# Patient Record
Sex: Male | Born: 1995 | Race: Black or African American | Hispanic: No | Marital: Single | Smoking: Never smoker
Health system: Southern US, Community
[De-identification: ages and names within clinical notes are randomized; demographics above are authoritative.]

## PROBLEM LIST (undated history)

## (undated) DIAGNOSIS — Z993 Dependence on wheelchair: Secondary | ICD-10-CM

## (undated) DIAGNOSIS — E559 Vitamin D deficiency, unspecified: Secondary | ICD-10-CM

## (undated) DIAGNOSIS — Z978 Presence of other specified devices: Secondary | ICD-10-CM

---

## 2019-09-30 ENCOUNTER — Encounter (HOSPITAL_COMMUNITY): Payer: Self-pay

## 2019-09-30 ENCOUNTER — Inpatient Hospital Stay (HOSPITAL_COMMUNITY): Payer: Worker's Compensation

## 2019-09-30 ENCOUNTER — Encounter (HOSPITAL_COMMUNITY): Admission: EM | Disposition: A | Discharge status: Rehabilitation Facility | Payer: Self-pay | Source: Home / Self Care

## 2019-09-30 ENCOUNTER — Inpatient Hospital Stay (HOSPITAL_COMMUNITY)
Admission: EM | Admit: 2019-09-30 | Discharge: 2019-10-12 | DRG: 958 | Disposition: A | Discharge status: Rehabilitation Facility | Payer: Worker's Compensation | Attending: Surgery | Admitting: Surgery

## 2019-09-30 ENCOUNTER — Emergency Department (HOSPITAL_COMMUNITY): Payer: Worker's Compensation

## 2019-09-30 ENCOUNTER — Emergency Department (HOSPITAL_COMMUNITY): Payer: Worker's Compensation | Admitting: Anesthesiology

## 2019-09-30 DIAGNOSIS — S3210XA Unspecified fracture of sacrum, initial encounter for closed fracture: Secondary | ICD-10-CM | POA: Diagnosis present

## 2019-09-30 DIAGNOSIS — R7881 Bacteremia: Secondary | ICD-10-CM | POA: Diagnosis not present

## 2019-09-30 DIAGNOSIS — S0081XA Abrasion of other part of head, initial encounter: Secondary | ICD-10-CM | POA: Diagnosis present

## 2019-09-30 DIAGNOSIS — G479 Sleep disorder, unspecified: Secondary | ICD-10-CM | POA: Diagnosis not present

## 2019-09-30 DIAGNOSIS — R05 Cough: Secondary | ICD-10-CM | POA: Diagnosis not present

## 2019-09-30 DIAGNOSIS — S32059A Unspecified fracture of fifth lumbar vertebra, initial encounter for closed fracture: Secondary | ICD-10-CM | POA: Diagnosis present

## 2019-09-30 DIAGNOSIS — M7989 Other specified soft tissue disorders: Secondary | ICD-10-CM | POA: Diagnosis not present

## 2019-09-30 DIAGNOSIS — S81011A Laceration without foreign body, right knee, initial encounter: Secondary | ICD-10-CM | POA: Diagnosis present

## 2019-09-30 DIAGNOSIS — S022XXA Fracture of nasal bones, initial encounter for closed fracture: Secondary | ICD-10-CM | POA: Diagnosis present

## 2019-09-30 DIAGNOSIS — H53142 Visual discomfort, left eye: Secondary | ICD-10-CM | POA: Diagnosis present

## 2019-09-30 DIAGNOSIS — I959 Hypotension, unspecified: Secondary | ICD-10-CM | POA: Diagnosis present

## 2019-09-30 DIAGNOSIS — E559 Vitamin D deficiency, unspecified: Secondary | ICD-10-CM | POA: Diagnosis present

## 2019-09-30 DIAGNOSIS — S334XXA Traumatic rupture of symphysis pubis, initial encounter: Secondary | ICD-10-CM | POA: Diagnosis present

## 2019-09-30 DIAGNOSIS — R52 Pain, unspecified: Secondary | ICD-10-CM

## 2019-09-30 DIAGNOSIS — Z09 Encounter for follow-up examination after completed treatment for conditions other than malignant neoplasm: Secondary | ICD-10-CM

## 2019-09-30 DIAGNOSIS — E8889 Other specified metabolic disorders: Secondary | ICD-10-CM | POA: Diagnosis present

## 2019-09-30 DIAGNOSIS — S51012A Laceration without foreign body of left elbow, initial encounter: Secondary | ICD-10-CM | POA: Diagnosis present

## 2019-09-30 DIAGNOSIS — R200 Anesthesia of skin: Secondary | ICD-10-CM | POA: Diagnosis not present

## 2019-09-30 DIAGNOSIS — Z23 Encounter for immunization: Secondary | ICD-10-CM | POA: Diagnosis not present

## 2019-09-30 DIAGNOSIS — Z20822 Contact with and (suspected) exposure to covid-19: Secondary | ICD-10-CM | POA: Diagnosis present

## 2019-09-30 DIAGNOSIS — S32811A Multiple fractures of pelvis with unstable disruption of pelvic ring, initial encounter for closed fracture: Secondary | ICD-10-CM | POA: Diagnosis present

## 2019-09-30 DIAGNOSIS — R310 Gross hematuria: Secondary | ICD-10-CM | POA: Diagnosis not present

## 2019-09-30 DIAGNOSIS — D696 Thrombocytopenia, unspecified: Secondary | ICD-10-CM | POA: Diagnosis not present

## 2019-09-30 DIAGNOSIS — S3730XA Unspecified injury of urethra, initial encounter: Secondary | ICD-10-CM | POA: Diagnosis present

## 2019-09-30 DIAGNOSIS — H532 Diplopia: Secondary | ICD-10-CM | POA: Diagnosis not present

## 2019-09-30 DIAGNOSIS — S0231XA Fracture of orbital floor, right side, initial encounter for closed fracture: Secondary | ICD-10-CM

## 2019-09-30 DIAGNOSIS — R443 Hallucinations, unspecified: Secondary | ICD-10-CM | POA: Diagnosis not present

## 2019-09-30 DIAGNOSIS — T1490XA Injury, unspecified, initial encounter: Secondary | ICD-10-CM | POA: Diagnosis present

## 2019-09-30 DIAGNOSIS — N5089 Other specified disorders of the male genital organs: Secondary | ICD-10-CM | POA: Diagnosis not present

## 2019-09-30 DIAGNOSIS — S32512A Fracture of superior rim of left pubis, initial encounter for closed fracture: Secondary | ICD-10-CM | POA: Diagnosis present

## 2019-09-30 DIAGNOSIS — S7401XA Injury of sciatic nerve at hip and thigh level, right leg, initial encounter: Secondary | ICD-10-CM | POA: Diagnosis present

## 2019-09-30 DIAGNOSIS — S0232XA Fracture of orbital floor, left side, initial encounter for closed fracture: Secondary | ICD-10-CM | POA: Diagnosis present

## 2019-09-30 DIAGNOSIS — R000 Tachycardia, unspecified: Secondary | ICD-10-CM | POA: Diagnosis present

## 2019-09-30 DIAGNOSIS — S329XXA Fracture of unspecified parts of lumbosacral spine and pelvis, initial encounter for closed fracture: Secondary | ICD-10-CM

## 2019-09-30 DIAGNOSIS — B957 Other staphylococcus as the cause of diseases classified elsewhere: Secondary | ICD-10-CM | POA: Diagnosis not present

## 2019-09-30 DIAGNOSIS — Z419 Encounter for procedure for purposes other than remedying health state, unspecified: Secondary | ICD-10-CM

## 2019-09-30 DIAGNOSIS — R609 Edema, unspecified: Secondary | ICD-10-CM | POA: Diagnosis not present

## 2019-09-30 DIAGNOSIS — M21371 Foot drop, right foot: Secondary | ICD-10-CM | POA: Diagnosis not present

## 2019-09-30 DIAGNOSIS — D62 Acute posthemorrhagic anemia: Secondary | ICD-10-CM | POA: Diagnosis present

## 2019-09-30 DIAGNOSIS — Z9289 Personal history of other medical treatment: Secondary | ICD-10-CM

## 2019-09-30 DIAGNOSIS — M532X8 Spinal instabilities, sacral and sacrococcygeal region: Secondary | ICD-10-CM | POA: Diagnosis present

## 2019-09-30 DIAGNOSIS — R509 Fever, unspecified: Secondary | ICD-10-CM

## 2019-09-30 DIAGNOSIS — S32121A Minimally displaced Zone II fracture of sacrum, initial encounter for closed fracture: Secondary | ICD-10-CM | POA: Diagnosis present

## 2019-09-30 HISTORY — DX: Personal history of other medical treatment: Z92.89

## 2019-09-30 HISTORY — PX: EXTERNAL FIXATION PELVIS: SHX1551

## 2019-09-30 HISTORY — PX: INSERTION OF SUPRAPUBIC CATHETER: SHX5870

## 2019-09-30 LAB — COMPREHENSIVE METABOLIC PANEL
ALT: 35 U/L (ref 0–44)
AST: 40 U/L (ref 15–41)
Albumin: 3.8 g/dL (ref 3.5–5.0)
Alkaline Phosphatase: 41 U/L (ref 38–126)
Anion gap: 14 (ref 5–15)
BUN: 14 mg/dL (ref 6–20)
CO2: 21 mmol/L — ABNORMAL LOW (ref 22–32)
Calcium: 9 mg/dL (ref 8.9–10.3)
Chloride: 104 mmol/L (ref 98–111)
Creatinine, Ser: 1.85 mg/dL — ABNORMAL HIGH (ref 0.61–1.24)
GFR calc Af Amer: 58 mL/min — ABNORMAL LOW (ref >60)
GFR calc non Af Amer: 50 mL/min — ABNORMAL LOW (ref >60)
Glucose, Bld: 210 mg/dL — ABNORMAL HIGH (ref 70–99)
Potassium: 3.3 mmol/L — ABNORMAL LOW (ref 3.5–5.1)
Sodium: 139 mmol/L (ref 135–145)
Total Bilirubin: 0.7 mg/dL (ref 0.3–1.2)
Total Protein: 6.1 g/dL — ABNORMAL LOW (ref 6.5–8.1)

## 2019-09-30 LAB — POCT I-STAT 7, (LYTES, BLD GAS, ICA,H+H)
Acid-Base Excess: 0 mmol/L (ref 0.0–2.0)
Bicarbonate: 26.8 mmol/L (ref 20.0–28.0)
Calcium, Ion: 1.29 mmol/L (ref 1.15–1.40)
HCT: 36 % — ABNORMAL LOW (ref 39.0–52.0)
Hemoglobin: 12.2 g/dL — ABNORMAL LOW (ref 13.0–17.0)
O2 Saturation: 100 %
Patient temperature: 36
Potassium: 4.1 mmol/L (ref 3.5–5.1)
Sodium: 142 mmol/L (ref 135–145)
TCO2: 28 mmol/L (ref 22–32)
pCO2 arterial: 49.8 mmHg — ABNORMAL HIGH (ref 32.0–48.0)
pH, Arterial: 7.334 — ABNORMAL LOW (ref 7.350–7.450)
pO2, Arterial: 564 mmHg — ABNORMAL HIGH (ref 83.0–108.0)

## 2019-09-30 LAB — I-STAT CHEM 8, ED
BUN: 14 mg/dL (ref 6–20)
Calcium, Ion: 1.02 mmol/L — ABNORMAL LOW (ref 1.15–1.40)
Chloride: 105 mmol/L (ref 98–111)
Creatinine, Ser: 1.8 mg/dL — ABNORMAL HIGH (ref 0.61–1.24)
Glucose, Bld: 196 mg/dL — ABNORMAL HIGH (ref 70–99)
HCT: 40 % (ref 39.0–52.0)
Hemoglobin: 13.6 g/dL (ref 13.0–17.0)
Potassium: 3.3 mmol/L — ABNORMAL LOW (ref 3.5–5.1)
Sodium: 138 mmol/L (ref 135–145)
TCO2: 18 mmol/L — ABNORMAL LOW (ref 22–32)

## 2019-09-30 LAB — CBC
HCT: 40.9 % (ref 39.0–52.0)
Hemoglobin: 13.2 g/dL (ref 13.0–17.0)
MCH: 27.4 pg (ref 26.0–34.0)
MCHC: 32.3 g/dL (ref 30.0–36.0)
MCV: 85 fL (ref 80.0–100.0)
Platelets: 268 10*3/uL (ref 150–400)
RBC: 4.81 MIL/uL (ref 4.22–5.81)
RDW: 14.3 % (ref 11.5–15.5)
WBC: 23.3 10*3/uL — ABNORMAL HIGH (ref 4.0–10.5)
nRBC: 0 % (ref 0.0–0.2)

## 2019-09-30 LAB — PROTIME-INR
INR: 1 (ref 0.8–1.2)
Prothrombin Time: 12.6 seconds (ref 11.4–15.2)

## 2019-09-30 LAB — ABO/RH: ABO/RH(D): O NEG

## 2019-09-30 LAB — ETHANOL: Alcohol, Ethyl (B): <10 mg/dL (ref <10)

## 2019-09-30 LAB — PREPARE RBC (CROSSMATCH)

## 2019-09-30 LAB — LACTIC ACID, PLASMA: Lactic Acid, Venous: 6.1 mmol/L (ref 0.5–1.9)

## 2019-09-30 LAB — TRAUMA TEG PANEL
CFF Max Amplitude: 18.5 mm (ref 15–32)
Citrated Kaolin (R): 3.2 min — ABNORMAL LOW (ref 4.6–9.1)
Citrated Rapid TEG (MA): 58.5 mm (ref 52–70)
Lysis at 30 Minutes: 0 % (ref 0.0–2.6)

## 2019-09-30 LAB — SARS CORONAVIRUS 2 BY RT PCR (HOSPITAL ORDER, PERFORMED IN ~~LOC~~ HOSPITAL LAB): SARS Coronavirus 2: NEGATIVE

## 2019-09-30 SURGERY — EXTERNAL FIXATION, PELVIS
Anesthesia: General | Site: Pelvis

## 2019-09-30 SURGERY — EXTERNAL FIXATION, PELVIS
Anesthesia: Choice

## 2019-09-30 MED ORDER — FENTANYL CITRATE (PF) 250 MCG/5ML IJ SOLN
INTRAMUSCULAR | Status: AC
  Filled 2019-09-30: qty 5

## 2019-09-30 MED ORDER — SODIUM CHLORIDE 0.9 % IV SOLN: INTRAVENOUS | Status: DC | PRN

## 2019-09-30 MED ORDER — BUPIVACAINE HCL (PF) 0.5 % IJ SOLN
INTRAMUSCULAR | Status: AC
  Filled 2019-09-30: qty 30

## 2019-09-30 MED ORDER — CEFAZOLIN SODIUM-DEXTROSE 2-3 GM-%(50ML) IV SOLR
INTRAVENOUS | Status: DC | PRN
  Administered 2019-09-30: 2 g via INTRAVENOUS

## 2019-09-30 MED ORDER — FENTANYL CITRATE (PF) 100 MCG/2ML IJ SOLN
INTRAMUSCULAR | Status: AC | PRN
  Administered 2019-09-30 (×2): 50 ug via INTRAVENOUS

## 2019-09-30 MED ORDER — FENTANYL CITRATE (PF) 250 MCG/5ML IJ SOLN
INTRAMUSCULAR | Status: DC | PRN
  Administered 2019-09-30 (×3): 100 ug via INTRAVENOUS
  Administered 2019-09-30 (×4): 50 ug via INTRAVENOUS

## 2019-09-30 MED ORDER — CALCIUM CHLORIDE 10 % IV SOLN
INTRAVENOUS | Status: AC | PRN
  Administered 2019-09-30: 1 g via INTRAVENOUS

## 2019-09-30 MED ORDER — SODIUM CHLORIDE 0.9 % IV SOLN
INTRAVENOUS | Status: AC | PRN
  Administered 2019-09-30: 2000 mL via INTRAVENOUS

## 2019-09-30 MED ORDER — PROPOFOL 10 MG/ML IV BOLUS
INTRAVENOUS | Status: DC | PRN
  Administered 2019-09-30: 200 mg via INTRAVENOUS

## 2019-09-30 MED ORDER — PHENYLEPHRINE 40 MCG/ML (10ML) SYRINGE FOR IV PUSH (FOR BLOOD PRESSURE SUPPORT)
PREFILLED_SYRINGE | INTRAVENOUS | Status: AC
  Filled 2019-09-30: qty 10

## 2019-09-30 MED ORDER — MIDAZOLAM HCL 2 MG/2ML IJ SOLN
INTRAMUSCULAR | Status: AC
  Filled 2019-09-30: qty 2

## 2019-09-30 MED ORDER — DIPHENHYDRAMINE HCL 50 MG/ML IJ SOLN
INTRAMUSCULAR | Status: DC | PRN
  Administered 2019-09-30: 12.5 mg via INTRAVENOUS

## 2019-09-30 MED ORDER — SUCCINYLCHOLINE CHLORIDE 200 MG/10ML IV SOSY
PREFILLED_SYRINGE | INTRAVENOUS | Status: AC
  Filled 2019-09-30: qty 10

## 2019-09-30 MED ORDER — ONDANSETRON HCL 4 MG/2ML IJ SOLN: 4.0000 mg | Freq: Four times a day (QID) | INTRAMUSCULAR | Status: DC | PRN

## 2019-09-30 MED ORDER — LIDOCAINE 2% (20 MG/ML) 5 ML SYRINGE
INTRAMUSCULAR | Status: AC
  Filled 2019-09-30: qty 5

## 2019-09-30 MED ORDER — FENTANYL CITRATE (PF) 100 MCG/2ML IJ SOLN: 25.0000 ug | INTRAMUSCULAR | Status: DC | PRN

## 2019-09-30 MED ORDER — PROPOFOL 10 MG/ML IV BOLUS
INTRAVENOUS | Status: AC
  Filled 2019-09-30: qty 40

## 2019-09-30 MED ORDER — SODIUM CHLORIDE 0.9 % IR SOLN
Status: DC | PRN
  Administered 2019-09-30: 3000 mL

## 2019-09-30 MED ORDER — ROCURONIUM BROMIDE 10 MG/ML (PF) SYRINGE
PREFILLED_SYRINGE | INTRAVENOUS | Status: AC
  Filled 2019-09-30: qty 20

## 2019-09-30 MED ORDER — DIPHENHYDRAMINE HCL 50 MG/ML IJ SOLN
INTRAMUSCULAR | Status: AC
  Filled 2019-09-30: qty 1

## 2019-09-30 MED ORDER — SODIUM CHLORIDE (PF) 0.9 % IJ SOLN
INTRAMUSCULAR | Status: AC
  Filled 2019-09-30: qty 10

## 2019-09-30 MED ORDER — POTASSIUM CHLORIDE IN NACL 20-0.9 MEQ/L-% IV SOLN
INTRAVENOUS | Status: DC
  Filled 2019-09-30: qty 1000

## 2019-09-30 MED ORDER — ROCURONIUM BROMIDE 100 MG/10ML IV SOLN
INTRAVENOUS | Status: DC | PRN
  Administered 2019-09-30: 100 mg via INTRAVENOUS

## 2019-09-30 MED ORDER — CEFAZOLIN SODIUM 1 G IJ SOLR
INTRAMUSCULAR | Status: AC
  Filled 2019-09-30: qty 40

## 2019-09-30 MED ORDER — 0.9 % SODIUM CHLORIDE (POUR BTL) OPTIME
TOPICAL | Status: DC | PRN
  Administered 2019-09-30: 1000 mL

## 2019-09-30 MED ORDER — ONDANSETRON HCL 4 MG/2ML IJ SOLN
INTRAMUSCULAR | Status: AC
  Filled 2019-09-30: qty 2

## 2019-09-30 MED ORDER — TRANEXAMIC ACID-NACL 1000-0.7 MG/100ML-% IV SOLN
INTRAVENOUS | Status: AC | PRN
  Administered 2019-09-30 (×2): 1000 mg via INTRAVENOUS

## 2019-09-30 MED ORDER — IOHEXOL 300 MG/ML  SOLN
100.0000 mL | Freq: Once | INTRAMUSCULAR | Status: AC | PRN
  Administered 2019-09-30: 100 mL via INTRAVENOUS

## 2019-09-30 MED ORDER — TETANUS-DIPHTH-ACELL PERTUSSIS 5-2.5-18.5 LF-MCG/0.5 IM SUSP
0.5000 mL | Freq: Once | INTRAMUSCULAR | Status: AC
  Administered 2019-09-30: 0.5 mL via INTRAMUSCULAR

## 2019-09-30 MED ORDER — LIDOCAINE HCL (CARDIAC) PF 100 MG/5ML IV SOSY
PREFILLED_SYRINGE | INTRAVENOUS | Status: DC | PRN
  Administered 2019-09-30: 80 mg via INTRATRACHEAL

## 2019-09-30 MED ORDER — OXYCODONE HCL 5 MG/5ML PO SOLN: 5.0000 mg | Freq: Once | ORAL | Status: DC | PRN

## 2019-09-30 MED ORDER — OXYCODONE HCL 5 MG PO TABS: 5.0000 mg | ORAL_TABLET | Freq: Once | ORAL | Status: DC | PRN

## 2019-09-30 MED ORDER — ARTIFICIAL TEARS OPHTHALMIC OINT
TOPICAL_OINTMENT | OPHTHALMIC | Status: AC
  Filled 2019-09-30: qty 3.5

## 2019-09-30 MED ORDER — SODIUM CHLORIDE 0.9% IV SOLUTION: Freq: Once | INTRAVENOUS | Status: AC

## 2019-09-30 MED ORDER — FENTANYL CITRATE (PF) 100 MCG/2ML IJ SOLN
INTRAMUSCULAR | Status: AC
  Filled 2019-09-30: qty 2

## 2019-09-30 MED ORDER — ONDANSETRON HCL 4 MG/2ML IJ SOLN
INTRAMUSCULAR | Status: DC | PRN
  Administered 2019-09-30: 4 mg via INTRAVENOUS

## 2019-09-30 MED ORDER — SUGAMMADEX SODIUM 500 MG/5ML IV SOLN
INTRAVENOUS | Status: DC | PRN
  Administered 2019-09-30: 300 mg via INTRAVENOUS

## 2019-09-30 MED ORDER — SUCCINYLCHOLINE CHLORIDE 20 MG/ML IJ SOLN
INTRAMUSCULAR | Status: DC | PRN
  Administered 2019-09-30: 140 mg via INTRAVENOUS

## 2019-09-30 MED ORDER — MIDAZOLAM HCL 5 MG/5ML IJ SOLN
INTRAMUSCULAR | Status: DC | PRN
  Administered 2019-09-30: 2 mg via INTRAVENOUS

## 2019-09-30 SURGICAL SUPPLY — 61 items
BAG URINE DRAINAGE (UROLOGICAL SUPPLIES) ×4 IMPLANT
BAG URINE LEG 500ML (DRAIN) ×4 IMPLANT
BENZOIN TINCTURE PRP APPL 2/3 (GAUZE/BANDAGES/DRESSINGS) ×12 IMPLANT
BIT DRILL CANN 4.5MM (BIT) ×4 IMPLANT
BLADE SURG 11 STRL SS (BLADE) ×4 IMPLANT
BLADE SURG 15 STRL LF DISP TIS (BLADE) ×2 IMPLANT
BLADE SURG 15 STRL SS (BLADE) ×2
BNDG GAUZE ELAST 4 BULKY (GAUZE/BANDAGES/DRESSINGS) ×8 IMPLANT
BRUSH SCRUB EZ PLAIN DRY (MISCELLANEOUS) ×8 IMPLANT
CHLORAPREP W/TINT 26 (MISCELLANEOUS) ×4 IMPLANT
CLAMP LG COMBINATION (Clamp) ×12 IMPLANT
COVER SURGICAL LIGHT HANDLE (MISCELLANEOUS) ×16 IMPLANT
COVER WAND RF STERILE (DRAPES) ×8 IMPLANT
DRAPE C-ARM 42X72 X-RAY (DRAPES) ×4 IMPLANT
DRAPE INCISE IOBAN 66X45 STRL (DRAPES) ×4 IMPLANT
DRAPE INCISE IOBAN 85X60 (DRAPES) ×4 IMPLANT
DRAPE SURG 17X23 STRL (DRAPES) ×24 IMPLANT
DRILL BIT CANN 4.5MM (BIT) ×4
DRSG ADAPTIC 3X8 NADH LF (GAUZE/BANDAGES/DRESSINGS) ×4 IMPLANT
ELECT REM PT RETURN 9FT ADLT (ELECTROSURGICAL) ×4
ELECTRODE REM PT RTRN 9FT ADLT (ELECTROSURGICAL) ×2 IMPLANT
GAUZE SPONGE 4X4 12PLY STRL (GAUZE/BANDAGES/DRESSINGS) ×4 IMPLANT
GLOVE BIO SURGEON STRL SZ 6.5 (GLOVE) ×9 IMPLANT
GLOVE BIO SURGEON STRL SZ7.5 (GLOVE) ×16 IMPLANT
GLOVE BIO SURGEONS STRL SZ 6.5 (GLOVE) ×3
GLOVE BIOGEL PI IND STRL 6.5 (GLOVE) ×2 IMPLANT
GLOVE BIOGEL PI IND STRL 7.5 (GLOVE) ×2 IMPLANT
GLOVE BIOGEL PI INDICATOR 6.5 (GLOVE) ×2
GLOVE BIOGEL PI INDICATOR 7.5 (GLOVE) ×2
GLOVE SURG SS PI 8.0 STRL IVOR (GLOVE) ×4 IMPLANT
GOWN STRL REUS W/ TWL LRG LVL3 (GOWN DISPOSABLE) ×4 IMPLANT
GOWN STRL REUS W/ TWL XL LVL3 (GOWN DISPOSABLE) ×4 IMPLANT
GOWN STRL REUS W/TWL LRG LVL3 (GOWN DISPOSABLE) ×4
GOWN STRL REUS W/TWL XL LVL3 (GOWN DISPOSABLE) ×4
GUIDEWIRE 2.0MM (WIRE) ×4 IMPLANT
KIT BASIN OR (CUSTOM PROCEDURE TRAY) ×4 IMPLANT
KIT SUPRAPUBIC CATH (MISCELLANEOUS) ×4 IMPLANT
KIT TURNOVER KIT B (KITS) ×4 IMPLANT
MANIFOLD NEPTUNE II (INSTRUMENTS) ×8 IMPLANT
NEEDLE HYPO 22GX1.5 SAFETY (NEEDLE) IMPLANT
NS IRRIG 1000ML POUR BTL (IV SOLUTION) ×8 IMPLANT
PACK CYSTO (CUSTOM PROCEDURE TRAY) ×4 IMPLANT
PACK GENERAL/GYN (CUSTOM PROCEDURE TRAY) ×4 IMPLANT
PACK UNIVERSAL I (CUSTOM PROCEDURE TRAY) ×4 IMPLANT
PAD ARMBOARD 7.5X6 YLW CONV (MISCELLANEOUS) ×8 IMPLANT
PENCIL BUTTON HOLSTER BLD 10FT (ELECTRODE) IMPLANT
PLUG CATH AND CAP STER (CATHETERS) IMPLANT
ROD CRBN FBR LRG EX-FX 11X300 (Rod) ×8 IMPLANT
SCREW SCHANZ EXFX THRD 6X250 (EXFIX) ×8 IMPLANT
SET ULTRATHANE SUPRAPUB MACLOC (MISCELLANEOUS) ×4 IMPLANT
STAPLER VISISTAT 35W (STAPLE) ×4 IMPLANT
SUT ETHILON 2 0 PSLX (SUTURE) ×8 IMPLANT
SUT ETHILON 3 0 FSL (SUTURE) ×4 IMPLANT
SUT MNCRL AB 3-0 PS2 18 (SUTURE) ×4 IMPLANT
SUT SILK 2 0 PERMA HAND 18 BK (SUTURE) ×4 IMPLANT
SUT VIC AB 2-0 FS1 27 (SUTURE) ×4 IMPLANT
TOWEL GREEN STERILE (TOWEL DISPOSABLE) ×12 IMPLANT
TOWEL GREEN STERILE FF (TOWEL DISPOSABLE) ×4 IMPLANT
UNDERPAD 30X36 HEAVY ABSORB (UNDERPADS AND DIAPERS) ×4 IMPLANT
WATER STERILE IRR 1000ML POUR (IV SOLUTION) ×4 IMPLANT
WATER STERILE IRR 3000ML UROMA (IV SOLUTION) ×4 IMPLANT
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
## 2019-09-30 NOTE — ED Notes (Signed)
Celvin Taney sister [phone] would like an update on the pt
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
## 2019-09-30 NOTE — ED Notes (Signed)
Assumed care on patient , Tranexamic Acid IV infusing , IV sites intact , pelvic binder intact , waiting for admitting MD .

## 2019-09-30 NOTE — Anesthesia Procedure Notes (Signed)
Procedure Name: Intubation Date/Time: 09/30/2019 10:15 PM Performed by: Claudina Lick, CRNA Pre-anesthesia Checklist: Patient identified, Emergency Drugs available, Suction available, Patient being monitored and Timeout performed Patient Re-evaluated:Patient Re-evaluated prior to induction Oxygen Delivery Method: Circle system utilized Preoxygenation: Pre-oxygenation with 100% oxygen Induction Type: IV induction, Rapid sequence and Cricoid Pressure applied Laryngoscope Size: Miller and 2 Grade View: Grade I Tube type: Oral Tube size: 8.0 mm Number of attempts: 1 Airway Equipment and Method: Stylet Placement Confirmation: ETT inserted through vocal cords under direct vision,  positive ETCO2 and breath sounds checked- equal and bilateral Secured at: 23 cm Tube secured with: Tape Dental Injury: Teeth and Oropharynx as per pre-operative assessment

## 2019-09-30 NOTE — Progress Notes (Signed)
Orthopedic Tech Progress Note Patient Details:  Marvin Smith May 08, 1995 [id number]  Patient ID: Marvin Smith, male   DOB: [birth year], 24 y.o.   MRN: [id number]  Level 2 Trauma not needed. Bella Kennedy A Trent Theisen 09/30/2019, 6:42 PM

## 2019-09-30 NOTE — Consult Note (Signed)
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 than 2 seconds.  Neurological:     General: No focal deficit present.     Mental Status: He is alert.  Psychiatric:        Mood and Affect: Mood normal.   Examination of bilateral upper extremities demonstrates good range of motion wrist elbow and shoulder with no crepitus grinding or tenderness to palpation.  Grip strength intact bilaterally.  Radial pulse intact bilaterally.  No tenderness to palpation along the clavicle.  Bilateral lower extremity examination demonstrates intact ankle dorsiflexion plantarflexion bilaterally.  Pedal pulses palpable bilaterally.  5 cm laceration over the patella is present which is jagged.  No knee effusion bilaterally.  Collateral and cruciate ligaments feel stable although the exam is difficult due to the  patient's size.  Compartments soft in the thigh and lower extremity.  Sensation intact dorsal palmar aspect of both hands dorsal palmar aspect of*dorsal and plantar aspect of both feet.  Abdominal binder in  place.   Assessment/Plan: Impression is significant pelvic injury with instability and blood loss which has been stabilized with abdominal binder.  Patient also has need of suprapubic catheter.  Plan tonight is external fixation followed by suprapubic catheter placement.  Risk and benefits are discussed include not limited to infection nerve vessel damage as well as potential need for more surgery.  Patient understands the risk and benefits.  We will also irrigate and close the right knee laceration.  No other extremity orthopedic injuries identified at this time.  This case was discussed at length with Dr. Jena Gauss who may be assisting with the external fixator placement.  We did have discussion about lowering the abdominal binder but that was felt to be not feasible in order to achieve enough exposure for the suprapubic catheter. To minimize the chance for blood loss requiring reresuscitation we will plan for external fixator placement first followed by suprapubic catheter placement second.  Marrianne Mood Gwendy Boeder 09/30/2019, 8:55 PM

## 2019-09-30 NOTE — ED Notes (Signed)
Transported to OR

## 2019-09-30 NOTE — Anesthesia Procedure Notes (Signed)
Arterial Line Insertion Start/End9/08/2019 9:15 PM, 09/30/2019 9:30 PM Performed by: Achille Rich, MD, Edmonia Caprio, CRNA, CRNA  Patient location: OR. Preanesthetic checklist: patient identified, IV checked and monitors and equipment checked Emergency situation radial was placed Catheter size: 20 G Maximum sterile barriers used   Attempts: 1 Procedure performed without using ultrasound guided technique. Ultrasound Notes:anatomy identified, needle tip was noted to be adjacent to the nerve/plexus identified and no ultrasound evidence of intravascular and/or intraneural injection Following insertion, Biopatch and dressing applied. Patient tolerated the procedure well with no immediate complications.

## 2019-09-30 NOTE — ED Provider Notes (Signed)
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 interventions, ordering and review of laboratory studies, ordering and review of radiographic studies, pulse oximetry and re-evaluation of patient's condition.   Final Clinical Impression(s) / ED Diagnoses Final diagnoses:  Trauma  Closed displaced fracture of pelvis, unspecified part of pelvis, initial encounter (HCC)  Hypotension, unspecified hypotension type  Closed fracture of right orbital floor, initial encounter [facility])    Rx / DC Orders ED Discharge Orders    None       Benjiman Core, MD 09/30/19 2205
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
## 2019-09-30 NOTE — Anesthesia Procedure Notes (Signed)
Central Venous Catheter Insertion Performed by: Achille Rich, MD, anesthesiologist Start/End9/08/2019 10:01 PM, 09/30/2019 10:10 PM Patient location: OR. Preanesthetic checklist: patient identified, IV checked, site marked, risks and benefits discussed, surgical consent, monitors and equipment checked, pre-op evaluation, timeout performed and anesthesia consent Position: Trendelenburg Patient sedated Hand hygiene performed , maximum sterile barriers used  and Seldinger technique used Catheter size: 7 Fr Central line was placed.Double lumen Procedure performed using ultrasound guided technique. Ultrasound Notes:anatomy identified, needle tip was noted to be adjacent to the nerve/plexus identified, no ultrasound evidence of intravascular and/or intraneural injection and image(s) printed for medical record Attempts: 1 Following insertion, line sutured, dressing applied and Biopatch. Post procedure assessment: blood return through all ports, free fluid flow and no air  Patient tolerated the procedure well with no immediate complications.

## 2019-09-30 NOTE — ED Notes (Signed)
Waiting call from OR .  

## 2019-10-01 ENCOUNTER — Inpatient Hospital Stay (HOSPITAL_COMMUNITY): Payer: Worker's Compensation

## 2019-10-01 ENCOUNTER — Other Ambulatory Visit: Payer: Self-pay

## 2019-10-01 ENCOUNTER — Encounter (HOSPITAL_COMMUNITY): Payer: Self-pay

## 2019-10-01 LAB — GLUCOSE, CAPILLARY
Glucose-Capillary: 159 mg/dL — ABNORMAL HIGH (ref 70–99)
Glucose-Capillary: 145 mg/dL — ABNORMAL HIGH (ref 70–99)

## 2019-10-01 LAB — CBC
HCT: 35.9 % — ABNORMAL LOW (ref 39.0–52.0)
HCT: 32.8 % — ABNORMAL LOW (ref 39.0–52.0)
Hemoglobin: 11.7 g/dL — ABNORMAL LOW (ref 13.0–17.0)
Hemoglobin: 11 g/dL — ABNORMAL LOW (ref 13.0–17.0)
MCH: 27.3 pg (ref 26.0–34.0)
MCH: 28.4 pg (ref 26.0–34.0)
MCHC: 32.6 g/dL (ref 30.0–36.0)
MCHC: 33.5 g/dL (ref 30.0–36.0)
MCV: 83.9 fL (ref 80.0–100.0)
MCV: 84.5 fL (ref 80.0–100.0)
Platelets: 128 10*3/uL — ABNORMAL LOW (ref 150–400)
Platelets: 105 10*3/uL — ABNORMAL LOW (ref 150–400)
RBC: 4.28 MIL/uL (ref 4.22–5.81)
RBC: 3.88 MIL/uL — ABNORMAL LOW (ref 4.22–5.81)
RDW: 14.4 % (ref 11.5–15.5)
RDW: 14.6 % (ref 11.5–15.5)
WBC: 7.5 10*3/uL (ref 4.0–10.5)
WBC: 6.4 10*3/uL (ref 4.0–10.5)
nRBC: 0 % (ref 0.0–0.2)
nRBC: 0 % (ref 0.0–0.2)

## 2019-10-01 LAB — PREPARE FRESH FROZEN PLASMA
Unit division: 0
Unit division: 0
Unit division: 0
Unit division: 0
Unit division: 0
Unit division: 0
Unit division: 0
Unit division: 0
Unit division: 0
Unit division: 0

## 2019-10-01 LAB — LACTIC ACID, PLASMA: Lactic Acid, Venous: 1.4 mmol/L (ref 0.5–1.9)

## 2019-10-01 LAB — BPAM FFP
Blood Product Expiration Date: 202109122359
Blood Product Expiration Date: 202109132359
Blood Product Expiration Date: 202109132359
Blood Product Expiration Date: 202109132359
Blood Product Expiration Date: 202109142359
Blood Product Expiration Date: 202109142359
Blood Product Expiration Date: 202109182359
Blood Product Expiration Date: 202109222359
Blood Product Expiration Date: 202109232359
Blood Product Expiration Date: 202109232359
Blood Product Expiration Date: 202109252359
Blood Product Expiration Date: 202109252359
ISSUE DATE / TIME: 202109081835
ISSUE DATE / TIME: 202109081842
ISSUE DATE / TIME: 202109081842
ISSUE DATE / TIME: 202109081844
ISSUE DATE / TIME: 202109081844
ISSUE DATE / TIME: 202109081851
ISSUE DATE / TIME: 202109081851
ISSUE DATE / TIME: 202109081915
ISSUE DATE / TIME: 202109082133
ISSUE DATE / TIME: 202109082133
ISSUE DATE / TIME: 202109082133
ISSUE DATE / TIME: 202109082133
Unit Type and Rh: 600
Unit Type and Rh: 600
Unit Type and Rh: 600
Unit Type and Rh: 600
Unit Type and Rh: 6200
Unit Type and Rh: 6200
Unit Type and Rh: 6200
Unit Type and Rh: 6200
Unit Type and Rh: 6200
Unit Type and Rh: 6200
Unit Type and Rh: 6200
Unit Type and Rh: 6200

## 2019-10-01 LAB — PREPARE PLATELET PHERESIS: Unit division: 0

## 2019-10-01 LAB — URINALYSIS, ROUTINE W REFLEX MICROSCOPIC
Bilirubin Urine: NEGATIVE
Glucose, UA: NEGATIVE mg/dL
Ketones, ur: NEGATIVE mg/dL
Leukocytes,Ua: NEGATIVE
Nitrite: NEGATIVE
Protein, ur: 30 mg/dL — AB
RBC / HPF: >50 RBC/hpf — ABNORMAL HIGH (ref 0–5)
Specific Gravity, Urine: 1.026 (ref 1.005–1.030)
pH: 5 (ref 5.0–8.0)

## 2019-10-01 LAB — HIV ANTIBODY (ROUTINE TESTING W REFLEX): HIV Screen 4th Generation wRfx: NONREACTIVE

## 2019-10-01 LAB — BPAM PLATELET PHERESIS
Blood Product Expiration Date: 202109112359
ISSUE DATE / TIME: 202109081911
Unit Type and Rh: 9500

## 2019-10-01 LAB — SURGICAL PCR SCREEN
MRSA, PCR: NEGATIVE
Staphylococcus aureus: POSITIVE — AB

## 2019-10-01 LAB — BLOOD PRODUCT ORDER (VERBAL) VERIFICATION

## 2019-10-01 LAB — MASSIVE TRANSFUSION PROTOCOL ORDER (BLOOD BANK NOTIFICATION)

## 2019-10-01 LAB — BASIC METABOLIC PANEL
Anion gap: 9 (ref 5–15)
BUN: 14 mg/dL (ref 6–20)
CO2: 21 mmol/L — ABNORMAL LOW (ref 22–32)
Calcium: 8.6 mg/dL — ABNORMAL LOW (ref 8.9–10.3)
Chloride: 109 mmol/L (ref 98–111)
Creatinine, Ser: 1.34 mg/dL — ABNORMAL HIGH (ref 0.61–1.24)
GFR calc Af Amer: >60 mL/min (ref >60)
GFR calc non Af Amer: >60 mL/min (ref >60)
Glucose, Bld: 147 mg/dL — ABNORMAL HIGH (ref 70–99)
Potassium: 4.5 mmol/L (ref 3.5–5.1)
Sodium: 139 mmol/L (ref 135–145)

## 2019-10-01 MED ORDER — HYDROMORPHONE HCL 1 MG/ML IJ SOLN: 0.5000 mg | INTRAMUSCULAR | Status: DC | PRN

## 2019-10-01 MED ORDER — METHOCARBAMOL 1000 MG/10ML IJ SOLN
1000.0000 mg | Freq: Three times a day (TID) | INTRAVENOUS | Status: DC | PRN
  Administered 2019-10-02: 1000 mg via INTRAVENOUS
  Filled 2019-10-01 (×2): qty 10

## 2019-10-01 MED ORDER — CHLORHEXIDINE GLUCONATE CLOTH 2 % EX PADS
6.0000 | MEDICATED_PAD | Freq: Every day | CUTANEOUS | Status: DC
  Administered 2019-10-01 – 2019-10-02 (×2): 6 via TOPICAL

## 2019-10-01 MED ORDER — HYDROMORPHONE HCL 1 MG/ML IJ SOLN
1.0000 mg | INTRAMUSCULAR | Status: DC | PRN
  Administered 2019-10-01 – 2019-10-05 (×22): 1 mg via INTRAVENOUS
  Filled 2019-10-01 (×22): qty 1

## 2019-10-01 MED ORDER — SODIUM CHLORIDE 0.9% FLUSH: 10.0000 mL | INTRAVENOUS | Status: DC | PRN

## 2019-10-01 MED ORDER — SODIUM CHLORIDE 0.9% FLUSH
10.0000 mL | Freq: Two times a day (BID) | INTRAVENOUS | Status: DC
  Administered 2019-10-01: 20 mL
  Administered 2019-10-01: 10 mL
  Administered 2019-10-02: 20 mL
  Administered 2019-10-02: 10 mL
  Administered 2019-10-03: 40 mL
  Administered 2019-10-03 – 2019-10-04 (×2): 10 mL
  Administered 2019-10-04: 20 mL
  Administered 2019-10-06 – 2019-10-07 (×2): 10 mL
  Administered 2019-10-07: 20 mL
  Administered 2019-10-08 – 2019-10-11 (×7): 10 mL

## 2019-10-01 MED ORDER — SODIUM CHLORIDE 0.9 % IV SOLN: INTRAVENOUS | Status: DC

## 2019-10-01 MED ORDER — OXYCODONE HCL 5 MG PO TABS
5.0000 mg | ORAL_TABLET | ORAL | Status: DC | PRN
  Administered 2019-10-01 – 2019-10-03 (×4): 10 mg via ORAL
  Filled 2019-10-01 (×4): qty 2

## 2019-10-01 MED ORDER — CHLORHEXIDINE GLUCONATE CLOTH 2 % EX PADS
6.0000 | MEDICATED_PAD | Freq: Every day | CUTANEOUS | Status: AC
  Administered 2019-10-01 – 2019-10-02 (×2): 6 via TOPICAL

## 2019-10-01 MED ORDER — ONDANSETRON 4 MG PO TBDP: 4.0000 mg | ORAL_TABLET | Freq: Four times a day (QID) | ORAL | Status: DC | PRN

## 2019-10-01 MED ORDER — ONDANSETRON HCL 4 MG/2ML IJ SOLN
4.0000 mg | Freq: Four times a day (QID) | INTRAMUSCULAR | Status: DC | PRN
  Administered 2019-10-01: 4 mg via INTRAVENOUS
  Filled 2019-10-01: qty 2

## 2019-10-01 MED ORDER — MUPIROCIN 2 % EX OINT
1.0000 "application " | TOPICAL_OINTMENT | Freq: Two times a day (BID) | CUTANEOUS | Status: AC
  Administered 2019-10-01 – 2019-10-05 (×10): 1 via NASAL
  Filled 2019-10-01 (×3): qty 22

## 2019-10-01 MED ORDER — ACETAMINOPHEN 10 MG/ML IV SOLN
1000.0000 mg | Freq: Four times a day (QID) | INTRAVENOUS | Status: AC
  Administered 2019-10-01 (×4): 1000 mg via INTRAVENOUS
  Filled 2019-10-01 (×4): qty 100

## 2019-10-01 NOTE — Anesthesia Postprocedure Evaluation (Signed)
Anesthesia Post Note  Patient: Engineer, materials  Procedure(s) Performed: EXTERNAL FIXATION PELVIS (N/A Pelvis) INSERTION OF SUPRAPUBIC CATHETER Cystoscopy (N/A Bladder)     Patient location during evaluation: PACU Anesthesia Type: General Level of consciousness: awake and alert Pain management: pain level controlled Vital Signs Assessment: post-procedure vital signs reviewed and stable Respiratory status: spontaneous breathing, nonlabored ventilation, respiratory function stable and patient connected to nasal cannula oxygen Cardiovascular status: blood pressure returned to baseline and stable Postop Assessment: no apparent nausea or vomiting Anesthetic complications: no   No complications documented.  Last Vitals:  Vitals:   10/01/19 0600 10/01/19 0700  BP:  135/77  Pulse: (!) 112 (!) 115  Resp: 19 20  Temp:    SpO2: 97% 94%    Last Pain:  Vitals:   10/01/19 0551  TempSrc:   PainSc: Asleep                 Ayslin Kundert S

## 2019-10-01 NOTE — Progress Notes (Signed)
Ortho Trauma Note  Patient seen and evaluated.  Having some drainage from his right lower pin site and laceration.  He is able to actively dorsiflex and plantarflex his ankle and foot.  He does note some diminished sensation to his foot as well.  His external fixator is stable with clean and dry pin sites.  Suprapubic catheter is in place.  I discussed with him need for proceeding with percutaneous fixation of his posterior pelvic ring as well as possible open reduction internal fixation of his anterior pelvic ring.  We will plan to proceed tomorrow.  Patient should be n.p.o. after midnight.  I discussed risks and benefits with the patient.  He agrees to proceed with surgery.  Consent order was placed.  Roby Lofts, MD [facility] [phone] (office) [email or web address]

## 2019-10-01 NOTE — Transfer of Care (Signed)
Immediate Anesthesia Transfer of Care Note  Patient: Engineer, materials  Procedure(s) Performed: EXTERNAL FIXATION PELVIS (N/A Pelvis) INSERTION OF SUPRAPUBIC CATHETER Cystoscopy (N/A Bladder)  Patient Location: PACU  Anesthesia Type:General  Level of Consciousness: drowsy  Airway & Oxygen Therapy: Patient Spontanous Breathing and Patient connected to face mask oxygen  Post-op Assessment: Report given to RN and Post -op Vital signs reviewed and stable  Post vital signs: Reviewed and stable  Last Vitals:  Vitals Value Taken Time  BP 115/103 10/01/19 0017  Temp    Pulse 120 10/01/19 0021  Resp 21 10/01/19 0021  SpO2 99 % 10/01/19 0021  Vitals shown include unvalidated device data.  Last Pain:  Vitals:   09/30/19 2048  TempSrc:   PainSc: 0-No pain         Complications: No complications documented.

## 2019-10-01 NOTE — Progress Notes (Signed)
Orthopedic Tech Progress Note Patient Details:  Marvin Smith 12/02/1995 [id number]  Musculoskeletal Traction Type of Traction: Skeletal (Balanced Suspension) Traction Location: RLE Traction Weight: 20 lbs   Post Interventions Patient Tolerated: Well   Marvin Smith 10/01/2019, 12:59 AM

## 2019-10-01 NOTE — Plan of Care (Signed)
  Problem: Clinical Measurements: Goal: Will remain free from infection Outcome: Progressing   Problem: Coping: Goal: Level of anxiety will decrease Outcome: Progressing   Problem: Pain Managment: Goal: General experience of comfort will improve Outcome: Progressing   Problem: Safety: Goal: Ability to remain free from injury will improve Outcome: Progressing   Problem: Skin Integrity: Goal: Risk for impaired skin integrity will decrease Outcome: Progressing   

## 2019-10-01 NOTE — TOC CAGE-AID Note (Signed)
Transition of Care [facility]) - CAGE-AID Screening   Patient Details  Name: Marvin Smith MRN: [id number] Date of Birth: [birth year]  Transition of Care [facility]) CM/SW Contact:    Emeterio Reeve, [place] Phone Number: 10/01/2019, 1:51 PM   Clinical Narrative:  CSW met with pt at bedside. CSW introduced self and explained her role at the hospital.  Pt reports social alcohol use. Pt denies substance use. Pt did not need any resources at this time.   CAGE-AID Screening:    Have You Ever Felt You Ought to Cut Down on Your Drinking or Drug Use?: No Have People Annoyed You By Critizing Your Drinking Or Drug Use?: No Have You Felt Bad Or Guilty About Your Drinking Or Drug Use?: No Have You Ever Had a Drink or Used Drugs First Thing In The Morning to Steady Your Nerves or to Get Rid of a Hangover?: No CAGE-AID Score: 0  Substance Abuse Education Offered: Yes    Blima Ledger, [place] Social Worker [phone]
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
## 2019-10-02 ENCOUNTER — Encounter (HOSPITAL_COMMUNITY): Payer: Self-pay

## 2019-10-02 ENCOUNTER — Inpatient Hospital Stay (HOSPITAL_COMMUNITY): Payer: Worker's Compensation | Admitting: Certified Registered"

## 2019-10-02 ENCOUNTER — Inpatient Hospital Stay (HOSPITAL_COMMUNITY): Payer: Worker's Compensation

## 2019-10-02 ENCOUNTER — Encounter (HOSPITAL_COMMUNITY): Admission: EM | Disposition: A | Discharge status: Rehabilitation Facility | Payer: Self-pay | Source: Home / Self Care

## 2019-10-02 DIAGNOSIS — S81011A Laceration without foreign body, right knee, initial encounter: Secondary | ICD-10-CM | POA: Diagnosis present

## 2019-10-02 DIAGNOSIS — S334XXA Traumatic rupture of symphysis pubis, initial encounter: Secondary | ICD-10-CM | POA: Diagnosis present

## 2019-10-02 DIAGNOSIS — S32512A Fracture of superior rim of left pubis, initial encounter for closed fracture: Secondary | ICD-10-CM | POA: Diagnosis present

## 2019-10-02 DIAGNOSIS — M532X8 Spinal instabilities, sacral and sacrococcygeal region: Secondary | ICD-10-CM | POA: Diagnosis present

## 2019-10-02 DIAGNOSIS — S3210XA Unspecified fracture of sacrum, initial encounter for closed fracture: Secondary | ICD-10-CM | POA: Diagnosis present

## 2019-10-02 DIAGNOSIS — S3730XA Unspecified injury of urethra, initial encounter: Secondary | ICD-10-CM | POA: Diagnosis present

## 2019-10-02 HISTORY — PX: ORIF PELVIC FRACTURE: SHX2128

## 2019-10-02 LAB — POCT I-STAT 7, (LYTES, BLD GAS, ICA,H+H)
Acid-Base Excess: 0 mmol/L (ref 0.0–2.0)
Bicarbonate: 25 mmol/L (ref 20.0–28.0)
Calcium, Ion: 1.15 mmol/L (ref 1.15–1.40)
HCT: 24 % — ABNORMAL LOW (ref 39.0–52.0)
Hemoglobin: 8.2 g/dL — ABNORMAL LOW (ref 13.0–17.0)
O2 Saturation: 100 %
Potassium: 4.1 mmol/L (ref 3.5–5.1)
Sodium: 138 mmol/L (ref 135–145)
TCO2: 26 mmol/L (ref 22–32)
pCO2 arterial: 43.8 mmHg (ref 32.0–48.0)
pH, Arterial: 7.363 (ref 7.350–7.450)
pO2, Arterial: 204 mmHg — ABNORMAL HIGH (ref 83.0–108.0)

## 2019-10-02 LAB — CBC
HCT: 29.5 % — ABNORMAL LOW (ref 39.0–52.0)
HCT: 26.3 % — ABNORMAL LOW (ref 39.0–52.0)
Hemoglobin: 9.6 g/dL — ABNORMAL LOW (ref 13.0–17.0)
Hemoglobin: 8.4 g/dL — ABNORMAL LOW (ref 13.0–17.0)
MCH: 27.9 pg (ref 26.0–34.0)
MCH: 27.5 pg (ref 26.0–34.0)
MCHC: 32.5 g/dL (ref 30.0–36.0)
MCHC: 31.9 g/dL (ref 30.0–36.0)
MCV: 85.8 fL (ref 80.0–100.0)
MCV: 86.2 fL (ref 80.0–100.0)
Platelets: 85 10*3/uL — ABNORMAL LOW (ref 150–400)
Platelets: 76 10*3/uL — ABNORMAL LOW (ref 150–400)
RBC: 3.44 MIL/uL — ABNORMAL LOW (ref 4.22–5.81)
RBC: 3.05 MIL/uL — ABNORMAL LOW (ref 4.22–5.81)
RDW: 14.6 % (ref 11.5–15.5)
RDW: 14.6 % (ref 11.5–15.5)
WBC: 7.7 10*3/uL (ref 4.0–10.5)
WBC: 7.8 10*3/uL (ref 4.0–10.5)
nRBC: 0 % (ref 0.0–0.2)
nRBC: 0 % (ref 0.0–0.2)

## 2019-10-02 LAB — BASIC METABOLIC PANEL
Anion gap: 11 (ref 5–15)
BUN: 13 mg/dL (ref 6–20)
CO2: 21 mmol/L — ABNORMAL LOW (ref 22–32)
Calcium: 8.3 mg/dL — ABNORMAL LOW (ref 8.9–10.3)
Chloride: 105 mmol/L (ref 98–111)
Creatinine, Ser: 0.97 mg/dL (ref 0.61–1.24)
GFR calc Af Amer: >60 mL/min (ref >60)
GFR calc non Af Amer: >60 mL/min (ref >60)
Glucose, Bld: 122 mg/dL — ABNORMAL HIGH (ref 70–99)
Potassium: 3.8 mmol/L (ref 3.5–5.1)
Sodium: 137 mmol/L (ref 135–145)

## 2019-10-02 LAB — PREPARE RBC (CROSSMATCH)

## 2019-10-02 SURGERY — OPEN REDUCTION INTERNAL FIXATION (ORIF) PELVIC FRACTURE
Anesthesia: General | Site: Pelvis | Laterality: Bilateral

## 2019-10-02 MED ORDER — FENTANYL CITRATE (PF) 250 MCG/5ML IJ SOLN
INTRAMUSCULAR | Status: AC
  Filled 2019-10-02: qty 5

## 2019-10-02 MED ORDER — ALBUMIN HUMAN 5 % IV SOLN: INTRAVENOUS | Status: DC | PRN

## 2019-10-02 MED ORDER — GABAPENTIN 100 MG PO CAPS
100.0000 mg | ORAL_CAPSULE | Freq: Three times a day (TID) | ORAL | Status: DC
  Administered 2019-10-02: 100 mg via ORAL
  Filled 2019-10-02: qty 1

## 2019-10-02 MED ORDER — SODIUM CHLORIDE 0.9 % IV SOLN: INTRAVENOUS | Status: DC | PRN

## 2019-10-02 MED ORDER — MIDAZOLAM HCL 5 MG/5ML IJ SOLN
INTRAMUSCULAR | Status: DC | PRN
  Administered 2019-10-02 (×2): 1 mg via INTRAVENOUS

## 2019-10-02 MED ORDER — CEFAZOLIN SODIUM-DEXTROSE 2-4 GM/100ML-% IV SOLN
2.0000 g | Freq: Three times a day (TID) | INTRAVENOUS | Status: AC
  Administered 2019-10-03 (×3): 2 g via INTRAVENOUS
  Filled 2019-10-02 (×3): qty 100

## 2019-10-02 MED ORDER — TOBRAMYCIN SULFATE 1.2 G IJ SOLR
INTRAMUSCULAR | Status: DC | PRN
  Administered 2019-10-02: 1.2 g via TOPICAL

## 2019-10-02 MED ORDER — ORAL CARE MOUTH RINSE: 15.0000 mL | Freq: Once | OROMUCOSAL | Status: AC

## 2019-10-02 MED ORDER — DEXAMETHASONE SODIUM PHOSPHATE 10 MG/ML IJ SOLN
INTRAMUSCULAR | Status: AC
  Filled 2019-10-02: qty 1

## 2019-10-02 MED ORDER — SODIUM CHLORIDE 0.9% IV SOLUTION: Freq: Once | INTRAVENOUS | Status: DC

## 2019-10-02 MED ORDER — CHLORHEXIDINE GLUCONATE 0.12 % MT SOLN: 15.0000 mL | Freq: Once | OROMUCOSAL | Status: AC

## 2019-10-02 MED ORDER — MIDAZOLAM HCL 2 MG/2ML IJ SOLN: 1.0000 mg | Freq: Once | INTRAMUSCULAR | Status: DC

## 2019-10-02 MED ORDER — ROCURONIUM BROMIDE 10 MG/ML (PF) SYRINGE
PREFILLED_SYRINGE | INTRAVENOUS | Status: DC | PRN
  Administered 2019-10-02: 60 mg via INTRAVENOUS
  Administered 2019-10-02: 20 mg via INTRAVENOUS
  Administered 2019-10-02: 30 mg via INTRAVENOUS
  Administered 2019-10-02 (×3): 20 mg via INTRAVENOUS

## 2019-10-02 MED ORDER — DEXTROSE 5 % IV SOLN
3.0000 g | INTRAVENOUS | Status: AC
  Administered 2019-10-02 (×2): 3 g via INTRAVENOUS
  Filled 2019-10-02: qty 3

## 2019-10-02 MED ORDER — HYDROMORPHONE HCL 1 MG/ML IJ SOLN
INTRAMUSCULAR | Status: AC
Start: 2019-10-02 — End: 2019-10-02
  Administered 2019-10-02: 0.5 mg via INTRAVENOUS
  Filled 2019-10-02: qty 1

## 2019-10-02 MED ORDER — ROCURONIUM BROMIDE 10 MG/ML (PF) SYRINGE
PREFILLED_SYRINGE | INTRAVENOUS | Status: AC
  Filled 2019-10-02: qty 10

## 2019-10-02 MED ORDER — PROPOFOL 10 MG/ML IV BOLUS
INTRAVENOUS | Status: AC
  Filled 2019-10-02: qty 20

## 2019-10-02 MED ORDER — DEXMEDETOMIDINE (PRECEDEX) IN NS 20 MCG/5ML (4 MCG/ML) IV SYRINGE
PREFILLED_SYRINGE | INTRAVENOUS | Status: DC | PRN
  Administered 2019-10-02: 10 ug via INTRAVENOUS
  Administered 2019-10-02 (×2): 20 ug via INTRAVENOUS
  Administered 2019-10-02: 10 ug via INTRAVENOUS
  Administered 2019-10-02: 8 ug via INTRAVENOUS
  Administered 2019-10-02: 12 ug via INTRAVENOUS

## 2019-10-02 MED ORDER — PHENYLEPHRINE 40 MCG/ML (10ML) SYRINGE FOR IV PUSH (FOR BLOOD PRESSURE SUPPORT)
PREFILLED_SYRINGE | INTRAVENOUS | Status: DC | PRN
  Administered 2019-10-02 (×3): 80 ug via INTRAVENOUS

## 2019-10-02 MED ORDER — ACETAMINOPHEN 500 MG PO TABS
1000.0000 mg | ORAL_TABLET | Freq: Four times a day (QID) | ORAL | Status: DC
  Administered 2019-10-02 – 2019-10-12 (×37): 1000 mg via ORAL
  Filled 2019-10-02 (×40): qty 2

## 2019-10-02 MED ORDER — TOBRAMYCIN SULFATE 1.2 G IJ SOLR
INTRAMUSCULAR | Status: AC
  Filled 2019-10-02: qty 1.2

## 2019-10-02 MED ORDER — LACTATED RINGERS IV SOLN: INTRAVENOUS | Status: DC

## 2019-10-02 MED ORDER — LACTATED RINGERS IV SOLN: INTRAVENOUS | Status: DC | PRN

## 2019-10-02 MED ORDER — CEFAZOLIN SODIUM 1 G IJ SOLR
INTRAMUSCULAR | Status: AC
  Filled 2019-10-02: qty 30

## 2019-10-02 MED ORDER — LIDOCAINE 2% (20 MG/ML) 5 ML SYRINGE
INTRAMUSCULAR | Status: AC
  Filled 2019-10-02: qty 5

## 2019-10-02 MED ORDER — MIDAZOLAM HCL 2 MG/2ML IJ SOLN
INTRAMUSCULAR | Status: AC
  Filled 2019-10-02: qty 2

## 2019-10-02 MED ORDER — FENTANYL CITRATE (PF) 100 MCG/2ML IJ SOLN
INTRAMUSCULAR | Status: DC | PRN
Start: 2019-10-02 — End: 2019-10-02
  Administered 2019-10-02 (×4): 50 ug via INTRAVENOUS
  Administered 2019-10-02: 100 ug via INTRAVENOUS
  Administered 2019-10-02 (×2): 50 ug via INTRAVENOUS
  Administered 2019-10-02 (×2): 100 ug via INTRAVENOUS
  Administered 2019-10-02: 50 ug via INTRAVENOUS

## 2019-10-02 MED ORDER — DEXAMETHASONE SODIUM PHOSPHATE 10 MG/ML IJ SOLN
INTRAMUSCULAR | Status: DC | PRN
  Administered 2019-10-02: 5 mg via INTRAVENOUS

## 2019-10-02 MED ORDER — PHENYLEPHRINE HCL-NACL 10-0.9 MG/250ML-% IV SOLN: INTRAVENOUS | Status: DC | PRN

## 2019-10-02 MED ORDER — ONDANSETRON HCL 4 MG/2ML IJ SOLN
INTRAMUSCULAR | Status: DC | PRN
  Administered 2019-10-02: 4 mg via INTRAVENOUS

## 2019-10-02 MED ORDER — ONDANSETRON HCL 4 MG/2ML IJ SOLN
INTRAMUSCULAR | Status: AC
  Filled 2019-10-02: qty 2

## 2019-10-02 MED ORDER — CHLORHEXIDINE GLUCONATE 0.12 % MT SOLN
OROMUCOSAL | Status: AC
  Administered 2019-10-02: 15 mL via OROMUCOSAL
  Filled 2019-10-02: qty 15

## 2019-10-02 MED ORDER — VANCOMYCIN HCL 1000 MG IV SOLR
INTRAVENOUS | Status: AC
  Filled 2019-10-02: qty 2000

## 2019-10-02 MED ORDER — HYDROMORPHONE HCL 1 MG/ML IJ SOLN
0.2500 mg | INTRAMUSCULAR | Status: DC | PRN
  Administered 2019-10-02: 0.5 mg via INTRAVENOUS

## 2019-10-02 MED ORDER — HYDROMORPHONE HCL 1 MG/ML IJ SOLN
INTRAMUSCULAR | Status: AC
  Administered 2019-10-02: 1 mg via INTRAVENOUS
  Filled 2019-10-02: qty 1

## 2019-10-02 MED ORDER — LIDOCAINE 2% (20 MG/ML) 5 ML SYRINGE
INTRAMUSCULAR | Status: DC | PRN
  Administered 2019-10-02: 60 mg via INTRAVENOUS

## 2019-10-02 MED ORDER — 0.9 % SODIUM CHLORIDE (POUR BTL) OPTIME
TOPICAL | Status: DC | PRN
  Administered 2019-10-02: 1000 mL

## 2019-10-02 MED ORDER — PHENYLEPHRINE 40 MCG/ML (10ML) SYRINGE FOR IV PUSH (FOR BLOOD PRESSURE SUPPORT)
PREFILLED_SYRINGE | INTRAVENOUS | Status: AC
  Filled 2019-10-02: qty 10

## 2019-10-02 MED ORDER — DEXMEDETOMIDINE (PRECEDEX) IN NS 20 MCG/5ML (4 MCG/ML) IV SYRINGE
PREFILLED_SYRINGE | INTRAVENOUS | Status: AC
  Filled 2019-10-02: qty 10

## 2019-10-02 MED ORDER — SUGAMMADEX SODIUM 200 MG/2ML IV SOLN
INTRAVENOUS | Status: DC | PRN
  Administered 2019-10-02: 300 mg via INTRAVENOUS

## 2019-10-02 MED ORDER — PROPOFOL 10 MG/ML IV BOLUS
INTRAVENOUS | Status: DC | PRN
  Administered 2019-10-02: 200 mg via INTRAVENOUS

## 2019-10-02 MED ORDER — SUCCINYLCHOLINE CHLORIDE 200 MG/10ML IV SOSY
PREFILLED_SYRINGE | INTRAVENOUS | Status: DC | PRN
  Administered 2019-10-02: 130 mg via INTRAVENOUS

## 2019-10-02 MED ORDER — VANCOMYCIN HCL 1000 MG IV SOLR
INTRAVENOUS | Status: DC | PRN
  Administered 2019-10-02: 1000 mg via TOPICAL

## 2019-10-02 SURGICAL SUPPLY — 84 items
APPLIER CLIP 11 MED OPEN (CLIP) ×3
BIT DRILL CANN 4.5MM (BIT) IMPLANT
BIT DRILL FLUTED 2.5 (BIT) IMPLANT
BLADE CLIPPER SURG (BLADE) IMPLANT
BNDG GAUZE ELAST 4 BULKY (GAUZE/BANDAGES/DRESSINGS) ×2 IMPLANT
CHLORAPREP W/TINT 26 (MISCELLANEOUS) ×5 IMPLANT
CLIP APPLIE 11 MED OPEN (CLIP) IMPLANT
COVER WAND RF STERILE (DRAPES) ×1 IMPLANT
DERMABOND ADVANCED (GAUZE/BANDAGES/DRESSINGS) ×2
DERMABOND ADVANCED .7 DNX12 (GAUZE/BANDAGES/DRESSINGS) ×2 IMPLANT
DRAIN CHANNEL 15F RND FF W/TCR (WOUND CARE) IMPLANT
DRAPE C-ARM 42X72 X-RAY (DRAPES) ×3 IMPLANT
DRAPE C-ARMOR (DRAPES) ×3 IMPLANT
DRAPE HALF SHEET 40X57 (DRAPES) ×6 IMPLANT
DRAPE INCISE IOBAN 66X45 STRL (DRAPES) ×10 IMPLANT
DRAPE SURG 17X23 STRL (DRAPES) ×18 IMPLANT
DRAPE U-SHAPE 47X51 STRL (DRAPES) ×1 IMPLANT
DRILL BIT CANN 4.5MM (BIT) ×4
DRILL BIT FLUTED 2.5 (BIT) ×3
DRSG EMULSION OIL 3X3 NADH (GAUZE/BANDAGES/DRESSINGS) ×2 IMPLANT
DRSG MEPILEX BORDER 4X4 (GAUZE/BANDAGES/DRESSINGS) ×6 IMPLANT
DRSG MEPILEX BORDER 4X8 (GAUZE/BANDAGES/DRESSINGS) ×4 IMPLANT
ELECT BLADE 4.0 EZ CLEAN MEGAD (MISCELLANEOUS) ×3
ELECT REM PT RETURN 9FT ADLT (ELECTROSURGICAL) ×3
ELECTRODE BLDE 4.0 EZ CLN MEGD (MISCELLANEOUS) IMPLANT
ELECTRODE REM PT RTRN 9FT ADLT (ELECTROSURGICAL) ×1 IMPLANT
EVACUATOR SILICONE 100CC (DRAIN) IMPLANT
GAUZE SPONGE 4X4 12PLY STRL (GAUZE/BANDAGES/DRESSINGS) ×2 IMPLANT
GLOVE BIO SURGEON STRL SZ 6.5 (GLOVE) ×6 IMPLANT
GLOVE BIO SURGEON STRL SZ7.5 (GLOVE) ×12 IMPLANT
GLOVE BIO SURGEONS STRL SZ 6.5 (GLOVE) ×3
GLOVE BIOGEL PI IND STRL 6.5 (GLOVE) ×1 IMPLANT
GLOVE BIOGEL PI IND STRL 7.5 (GLOVE) ×1 IMPLANT
GLOVE BIOGEL PI INDICATOR 6.5 (GLOVE) ×2
GLOVE BIOGEL PI INDICATOR 7.5 (GLOVE) ×2
GOWN STRL REUS W/ TWL LRG LVL3 (GOWN DISPOSABLE) ×2 IMPLANT
GOWN STRL REUS W/TWL LRG LVL3 (GOWN DISPOSABLE) ×4
GUIDEWIRE 2.0MM (WIRE) ×4 IMPLANT
GUIDEWIRE THREADED 2.8MM (WIRE) ×4 IMPLANT
HANDPIECE INTERPULSE COAX TIP (DISPOSABLE) ×2
KIT BASIN OR (CUSTOM PROCEDURE TRAY) ×3 IMPLANT
KIT TURNOVER KIT B (KITS) ×3 IMPLANT
MANIFOLD NEPTUNE II (INSTRUMENTS) ×3 IMPLANT
NS IRRIG 1000ML POUR BTL (IV SOLUTION) ×6 IMPLANT
PACK TOTAL JOINT (CUSTOM PROCEDURE TRAY) ×3 IMPLANT
PACK UNIVERSAL I (CUSTOM PROCEDURE TRAY) ×3 IMPLANT
PAD ARMBOARD 7.5X6 YLW CONV (MISCELLANEOUS) ×6 IMPLANT
PLATE 8H 3.5 PROFILE RECON (Plate) ×2 IMPLANT
SCREW BONE CANN 7.3X90 S/D (Screw) ×2 IMPLANT
SCREW CANN 32 THRD/130 7.3 (Screw) ×4 IMPLANT
SCREW CORTEX 3.5 24MM (Screw) ×2 IMPLANT
SCREW CORTEX 3.5 26MM (Screw) ×4 IMPLANT
SCREW CORTEX 3.5 30MM (Screw) ×2 IMPLANT
SCREW CORTEX 3.5 32MM (Screw) ×2 IMPLANT
SCREW CORTEX 3.5 34MM (Screw) ×4 IMPLANT
SCREW CORTEX 3.5 38MM (Screw) ×4 IMPLANT
SCREW CORTEX 3.5X40MM (Screw) ×2 IMPLANT
SCREW LOCK CORT ST 3.5X24 (Screw) IMPLANT
SCREW LOCK CORT ST 3.5X26 (Screw) IMPLANT
SCREW LOCK CORT ST 3.5X30 (Screw) IMPLANT
SCREW LOCK CORT ST 3.5X32 (Screw) IMPLANT
SCREW LOCK CORT ST 3.5X34 (Screw) IMPLANT
SCREW LOCK CORT ST 3.5X38 (Screw) IMPLANT
SET HNDPC FAN SPRY TIP SCT (DISPOSABLE) IMPLANT
SPONGE LAP 18X18 RF (DISPOSABLE) IMPLANT
STAPLER VISISTAT 35W (STAPLE) ×3 IMPLANT
SUCTION FRAZIER HANDLE 10FR (MISCELLANEOUS) ×2
SUCTION TUBE FRAZIER 10FR DISP (MISCELLANEOUS) ×1 IMPLANT
SUT MNCRL AB 3-0 PS2 18 (SUTURE) ×1 IMPLANT
SUT MNCRL AB 3-0 PS2 27 (SUTURE) ×4 IMPLANT
SUT MNCRL+ AB 3-0 CT1 36 (SUTURE) IMPLANT
SUT MON AB 2-0 CT1 36 (SUTURE) ×1 IMPLANT
SUT MONOCRYL AB 3-0 CT1 36IN (SUTURE) ×2
SUT VIC AB 0 CT1 27 (SUTURE) ×2
SUT VIC AB 0 CT1 27XBRD ANBCTR (SUTURE) ×2 IMPLANT
SUT VIC AB 1 CT1 18XCR BRD 8 (SUTURE) IMPLANT
SUT VIC AB 1 CT1 8-18 (SUTURE) ×2
SUT VIC AB 2-0 CT1 27 (SUTURE) ×4
SUT VIC AB 2-0 CT1 TAPERPNT 27 (SUTURE) ×2 IMPLANT
TOWEL GREEN STERILE (TOWEL DISPOSABLE) ×6 IMPLANT
TOWEL GREEN STERILE FF (TOWEL DISPOSABLE) ×3 IMPLANT
TRAY FOLEY MTR SLVR 16FR STAT (SET/KITS/TRAYS/PACK) IMPLANT
WASHER FOR 5.0 SCREWS (Washer) ×6 IMPLANT
WATER STERILE IRR 1000ML POUR (IV SOLUTION) ×4 IMPLANT
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
## 2019-10-02 NOTE — Interval H&P Note (Signed)
History and Physical Interval Note:  10/02/2019 9:55 AM  Marvin Smith  has presented today for surgery, with the diagnosis of Pelvic fracture.  The various methods of treatment have been discussed with the patient and family. After consideration of risks, benefits and other options for treatment, the patient has consented to  Procedure(s): OPEN REDUCTION INTERNAL FIXATION (ORIF) PELVIC FRACTURE (Bilateral) as a surgical intervention.  The patient's history has been reviewed, patient examined, no change in status, stable for surgery.  I have reviewed the patient's chart and labs.  Questions were answered to the patient's satisfaction.     Caryn Bee P Laberta Wilbon
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
## 2019-10-02 NOTE — Care Management (Signed)
Workers Comp Case Manager   Jonne Ply RN Phone [phone] fax [phone]    Update given  Ronny Flurry RN

## 2019-10-02 NOTE — Transfer of Care (Signed)
Immediate Anesthesia Transfer of Care Note  Patient: Marvin Smith  Procedure(s) Performed: OPEN REDUCTION INTERNAL FIXATION (ORIF) PELVIC FRACTURE; REMOVAL OF EXTERNAL FIXATOR (Bilateral Pelvis)  Patient Location: PACU  Anesthesia Type:General  Level of Consciousness: awake  Airway & Oxygen Therapy: Patient Spontanous Breathing and Patient connected to face mask oxygen  Post-op Assessment: Report given to RN and Post -op Vital signs reviewed and stable  Post vital signs: Reviewed and stable  Last Vitals:  Vitals Value Taken Time  BP 151/105 10/02/19 1611  Temp    Pulse 119 10/02/19 1613  Resp 23 10/02/19 1613  SpO2 99 % 10/02/19 1613  Vitals shown include unvalidated device data.  Last Pain:  Vitals:   10/02/19 1004  TempSrc:   PainSc: 9       Patients Stated Pain Goal: 2 (10/02/19 1004)  Complications: No complications documented.

## 2019-10-02 NOTE — Anesthesia Procedure Notes (Signed)
Procedure Name: Intubation Date/Time: 10/02/2019 10:46 AM Performed by: Orlie Dakin, CRNA Pre-anesthesia Checklist: Patient identified, Emergency Drugs available, Suction available and Patient being monitored Patient Re-evaluated:Patient Re-evaluated prior to induction Oxygen Delivery Method: Circle system utilized Preoxygenation: Pre-oxygenation with 100% oxygen Induction Type: IV induction and Rapid sequence Laryngoscope Size: Mac and 4 Grade View: Grade II Tube type: Oral Tube size: 7.5 mm Number of attempts: 1 Airway Equipment and Method: Stylet Placement Confirmation: ETT inserted through vocal cords under direct vision,  positive ETCO2 and breath sounds checked- equal and bilateral Secured at: 23 cm Tube secured with: Tape Dental Injury: Teeth and Oropharynx as per pre-operative assessment  Comments: 4x4s bite block used.

## 2019-10-03 ENCOUNTER — Inpatient Hospital Stay (HOSPITAL_COMMUNITY): Payer: Worker's Compensation

## 2019-10-03 ENCOUNTER — Encounter (HOSPITAL_COMMUNITY): Payer: Self-pay

## 2019-10-03 LAB — CBC
HCT: 22.5 % — ABNORMAL LOW (ref 39.0–52.0)
Hemoglobin: 7.3 g/dL — ABNORMAL LOW (ref 13.0–17.0)
MCH: 28.2 pg (ref 26.0–34.0)
MCHC: 32.4 g/dL (ref 30.0–36.0)
MCV: 86.9 fL (ref 80.0–100.0)
Platelets: 81 10*3/uL — ABNORMAL LOW (ref 150–400)
RBC: 2.59 MIL/uL — ABNORMAL LOW (ref 4.22–5.81)
RDW: 14.5 % (ref 11.5–15.5)
WBC: 7.8 10*3/uL (ref 4.0–10.5)
nRBC: 0 % (ref 0.0–0.2)

## 2019-10-03 LAB — PREPARE PLATELET PHERESIS: Unit division: 0

## 2019-10-03 LAB — BPAM PLATELET PHERESIS
Blood Product Expiration Date: 202109122359
ISSUE DATE / TIME: 202109101403
Unit Type and Rh: 7300

## 2019-10-03 LAB — BASIC METABOLIC PANEL
Anion gap: 8 (ref 5–15)
BUN: 11 mg/dL (ref 6–20)
CO2: 25 mmol/L (ref 22–32)
Calcium: 8 mg/dL — ABNORMAL LOW (ref 8.9–10.3)
Chloride: 103 mmol/L (ref 98–111)
Creatinine, Ser: 1.01 mg/dL (ref 0.61–1.24)
GFR calc Af Amer: >60 mL/min (ref >60)
GFR calc non Af Amer: >60 mL/min (ref >60)
Glucose, Bld: 130 mg/dL — ABNORMAL HIGH (ref 70–99)
Potassium: 4.1 mmol/L (ref 3.5–5.1)
Sodium: 136 mmol/L (ref 135–145)

## 2019-10-03 LAB — PREPARE RBC (CROSSMATCH)

## 2019-10-03 LAB — VITAMIN D 25 HYDROXY (VIT D DEFICIENCY, FRACTURES): Vit D, 25-Hydroxy: 18.35 ng/mL — ABNORMAL LOW (ref 30–100)

## 2019-10-03 MED ORDER — ALPRAZOLAM 0.5 MG PO TABS
0.5000 mg | ORAL_TABLET | Freq: Once | ORAL | Status: AC
  Administered 2019-10-03: 0.5 mg via ORAL
  Filled 2019-10-03: qty 1

## 2019-10-03 MED ORDER — ACETAMINOPHEN 325 MG PO TABS: 650.0000 mg | ORAL_TABLET | Freq: Once | ORAL | Status: DC

## 2019-10-03 MED ORDER — METHOCARBAMOL 1000 MG/10ML IJ SOLN
1000.0000 mg | Freq: Three times a day (TID) | INTRAVENOUS | Status: DC
  Administered 2019-10-03 – 2019-10-06 (×8): 1000 mg via INTRAVENOUS
  Filled 2019-10-03 (×12): qty 10

## 2019-10-03 MED ORDER — LACTATED RINGERS IV SOLN: INTRAVENOUS | Status: DC

## 2019-10-03 MED ORDER — FUROSEMIDE 10 MG/ML IJ SOLN
20.0000 mg | Freq: Once | INTRAMUSCULAR | Status: AC
  Administered 2019-10-03: 20 mg via INTRAVENOUS
  Filled 2019-10-03: qty 2

## 2019-10-03 MED ORDER — GABAPENTIN 300 MG PO CAPS
300.0000 mg | ORAL_CAPSULE | Freq: Three times a day (TID) | ORAL | Status: DC
  Administered 2019-10-03 – 2019-10-04 (×5): 300 mg via ORAL
  Filled 2019-10-03 (×6): qty 1

## 2019-10-03 MED ORDER — SODIUM CHLORIDE 0.9% IV SOLUTION: Freq: Once | INTRAVENOUS | Status: AC

## 2019-10-03 MED ORDER — DIPHENHYDRAMINE HCL 25 MG PO CAPS
25.0000 mg | ORAL_CAPSULE | Freq: Once | ORAL | Status: AC
  Administered 2019-10-03: 25 mg via ORAL
  Filled 2019-10-03: qty 1

## 2019-10-03 MED ORDER — TRAMADOL HCL 50 MG PO TABS
50.0000 mg | ORAL_TABLET | Freq: Four times a day (QID) | ORAL | Status: DC
  Administered 2019-10-03 – 2019-10-07 (×16): 50 mg via ORAL
  Filled 2019-10-03 (×18): qty 1

## 2019-10-03 MED ORDER — KETOROLAC TROMETHAMINE 15 MG/ML IJ SOLN
30.0000 mg | Freq: Four times a day (QID) | INTRAMUSCULAR | Status: AC
  Administered 2019-10-03 – 2019-10-07 (×19): 30 mg via INTRAVENOUS
  Filled 2019-10-03 (×20): qty 2

## 2019-10-03 MED ORDER — OXYCODONE HCL 5 MG PO TABS
10.0000 mg | ORAL_TABLET | ORAL | Status: DC | PRN
  Administered 2019-10-03 – 2019-10-06 (×9): 15 mg via ORAL
  Administered 2019-10-06: 10 mg via ORAL
  Administered 2019-10-07 – 2019-10-08 (×4): 15 mg via ORAL
  Administered 2019-10-08 (×2): 10 mg via ORAL
  Administered 2019-10-09 (×4): 15 mg via ORAL
  Administered 2019-10-09: 10 mg via ORAL
  Administered 2019-10-10 – 2019-10-11 (×7): 15 mg via ORAL
  Administered 2019-10-11: 10 mg via ORAL
  Administered 2019-10-11 (×2): 15 mg via ORAL
  Administered 2019-10-12 (×2): 10 mg via ORAL
  Filled 2019-10-03 (×2): qty 3
  Filled 2019-10-03 (×2): qty 2
  Filled 2019-10-03 (×3): qty 3
  Filled 2019-10-03: qty 2
  Filled 2019-10-03 (×10): qty 3
  Filled 2019-10-03: qty 2
  Filled 2019-10-03 (×13): qty 3
  Filled 2019-10-03: qty 2
  Filled 2019-10-03: qty 3
  Filled 2019-10-03: qty 2

## 2019-10-03 MED ORDER — VITAMIN D (ERGOCALCIFEROL) 1.25 MG (50000 UNIT) PO CAPS
50000.0000 [IU] | ORAL_CAPSULE | ORAL | Status: DC
  Filled 2019-10-03 (×2): qty 1

## 2019-10-03 MED ORDER — VITAMIN D 25 MCG (1000 UNIT) PO TABS
2000.0000 [IU] | ORAL_TABLET | Freq: Two times a day (BID) | ORAL | Status: DC
  Administered 2019-10-03 – 2019-10-12 (×18): 2000 [IU] via ORAL
  Filled 2019-10-03 (×18): qty 2

## 2019-10-03 MED ORDER — GABAPENTIN 100 MG PO CAPS
200.0000 mg | ORAL_CAPSULE | Freq: Three times a day (TID) | ORAL | Status: DC
  Administered 2019-10-03: 200 mg via ORAL
  Filled 2019-10-03: qty 2

## 2019-10-03 MED ORDER — ASCORBIC ACID 500 MG PO TABS
1000.0000 mg | ORAL_TABLET | Freq: Every day | ORAL | Status: DC
  Administered 2019-10-03 – 2019-10-12 (×10): 1000 mg via ORAL
  Filled 2019-10-03 (×11): qty 2
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
## 2019-10-03 NOTE — Progress Notes (Signed)
Inpatient Rehab Admissions Coordinator Note:   Per PT/OT recommendations, pt was screened for CIR candidacy by Wolfgang Phoenix, MS, CCC-SLP.  At this time we are recommending an Inpatient Rehab Consult.  AC will place consult order per protocol.  Please contact me with questions.   Wolfgang Phoenix, MS, CCC-SLP Admissions Coordinator [phone] 10/03/19 2:49 PM
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
## 2019-10-03 NOTE — Progress Notes (Signed)
Verbal order per Dr. Corliss Skains to travel without a nurse.
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
## 2019-10-04 ENCOUNTER — Encounter (HOSPITAL_COMMUNITY): Payer: Self-pay

## 2019-10-04 DIAGNOSIS — E559 Vitamin D deficiency, unspecified: Secondary | ICD-10-CM | POA: Diagnosis present

## 2019-10-04 LAB — TYPE AND SCREEN
ABO/RH(D): O NEG
ABO/RH(D): O NEG
Antibody Screen: NEGATIVE
Antibody Screen: NEGATIVE
Unit division: 0
Unit division: 0
Unit division: 0
Unit division: 0
Unit division: 0
Unit division: 0
Unit division: 0
Unit division: 0
Unit division: 0
Unit division: 0
Unit division: 0
Unit division: 0
Unit division: 0
Unit division: 0
Unit division: 0
Unit division: 0
Unit division: 0
Unit division: 0

## 2019-10-04 LAB — BPAM RBC
Blood Product Expiration Date: 202109172359
Blood Product Expiration Date: 202109212359
Blood Product Expiration Date: 202109262359
Blood Product Expiration Date: 202109262359
Blood Product Expiration Date: 202109272359
Blood Product Expiration Date: 202109272359
Blood Product Expiration Date: 202109282359
Blood Product Expiration Date: 202110082359
Blood Product Expiration Date: 202110082359
Blood Product Expiration Date: 202110082359
Blood Product Expiration Date: 202110082359
Blood Product Expiration Date: 202110082359
Blood Product Expiration Date: 202110082359
Blood Product Expiration Date: 202110082359
Blood Product Expiration Date: 202110082359
Blood Product Expiration Date: 202110082359
Blood Product Expiration Date: 202110082359
Blood Product Expiration Date: 202110082359
ISSUE DATE / TIME: 202109081820
ISSUE DATE / TIME: 202109081832
ISSUE DATE / TIME: 202109081841
ISSUE DATE / TIME: 202109081841
ISSUE DATE / TIME: 202109081844
ISSUE DATE / TIME: 202109090926
ISSUE DATE / TIME: 202109090930
ISSUE DATE / TIME: 202109091018
ISSUE DATE / TIME: 202109091146
ISSUE DATE / TIME: 202109091155
ISSUE DATE / TIME: 202109091246
ISSUE DATE / TIME: 202109091321
ISSUE DATE / TIME: 202109101350
ISSUE DATE / TIME: 202109101403
ISSUE DATE / TIME: 202109101710
ISSUE DATE / TIME: 202109111708
ISSUE DATE / TIME: 202109112252
Unit Type and Rh: 5100
Unit Type and Rh: 5100
Unit Type and Rh: 5100
Unit Type and Rh: 5100
Unit Type and Rh: 5100
Unit Type and Rh: 5100
Unit Type and Rh: 5100
Unit Type and Rh: 5100
Unit Type and Rh: 5100
Unit Type and Rh: 5100
Unit Type and Rh: 5100
Unit Type and Rh: 5100
Unit Type and Rh: 5100
Unit Type and Rh: 9500
Unit Type and Rh: 9500
Unit Type and Rh: 9500
Unit Type and Rh: 9500
Unit Type and Rh: 9500

## 2019-10-04 LAB — CBC
HCT: 25.5 % — ABNORMAL LOW (ref 39.0–52.0)
Hemoglobin: 8.5 g/dL — ABNORMAL LOW (ref 13.0–17.0)
MCH: 28.6 pg (ref 26.0–34.0)
MCHC: 33.3 g/dL (ref 30.0–36.0)
MCV: 85.9 fL (ref 80.0–100.0)
Platelets: 100 10*3/uL — ABNORMAL LOW (ref 150–400)
RBC: 2.97 MIL/uL — ABNORMAL LOW (ref 4.22–5.81)
RDW: 14.2 % (ref 11.5–15.5)
WBC: 7.9 10*3/uL (ref 4.0–10.5)
nRBC: 0 % (ref 0.0–0.2)

## 2019-10-04 LAB — BASIC METABOLIC PANEL
Anion gap: 9 (ref 5–15)
BUN: 11 mg/dL (ref 6–20)
CO2: 24 mmol/L (ref 22–32)
Calcium: 8.2 mg/dL — ABNORMAL LOW (ref 8.9–10.3)
Chloride: 101 mmol/L (ref 98–111)
Creatinine, Ser: 0.97 mg/dL (ref 0.61–1.24)
GFR calc Af Amer: >60 mL/min (ref >60)
GFR calc non Af Amer: >60 mL/min (ref >60)
Glucose, Bld: 128 mg/dL — ABNORMAL HIGH (ref 70–99)
Potassium: 3.5 mmol/L (ref 3.5–5.1)
Sodium: 134 mmol/L — ABNORMAL LOW (ref 135–145)

## 2019-10-04 MED ORDER — VITAMIN D (ERGOCALCIFEROL) 1.25 MG (50000 UNIT) PO CAPS: 50000.0000 [IU] | ORAL_CAPSULE | ORAL | Status: DC

## 2019-10-04 MED ORDER — ALPRAZOLAM 0.5 MG PO TABS
1.0000 mg | ORAL_TABLET | Freq: Once | ORAL | Status: AC
  Administered 2019-10-05: 1 mg via ORAL
  Filled 2019-10-04: qty 2

## 2019-10-04 NOTE — Anesthesia Postprocedure Evaluation (Signed)
Anesthesia Post Note  Patient: Engineer, materials  Procedure(s) Performed: OPEN REDUCTION INTERNAL FIXATION (ORIF) PELVIC FRACTURE; REMOVAL OF EXTERNAL FIXATOR (Bilateral Pelvis)     Patient location during evaluation: PACU Anesthesia Type: General Level of consciousness: awake and alert, oriented and patient cooperative Pain management: pain level controlled Vital Signs Assessment: post-procedure vital signs reviewed and stable Respiratory status: spontaneous breathing, nonlabored ventilation and respiratory function stable Cardiovascular status: blood pressure returned to baseline and stable Postop Assessment: no apparent nausea or vomiting Anesthetic complications: no   No complications documented.  Last Vitals:  Vitals:   10/04/19 0500 10/04/19 0600  BP: (!) 152/77 (!) 165/91  Pulse: (!) 124 (!) 121  Resp: 20 19  Temp:    SpO2: 93% 96%    Last Pain:  Vitals:   10/04/19 0455  TempSrc:   PainSc: Asleep                 Lannie Fields
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
## 2019-10-04 NOTE — Progress Notes (Signed)
Inpatient Rehab Admissions:  Inpatient Rehab Consult received.  I met with patient and family at the bedside for rehabilitation assessment and to discuss goals and expectations of an inpatient rehab admission.  Both acknowledged understanding of CIR goals and expectations. Pt appears to be an appropriate candidate for potential admission. However, both reported they would prefer an Inpatient Rehab facility in [place] since that is where pt is from.  TOC made aware.   Signed: Gayland Curry, [place], [place] Admissions Coordinator [phone]
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
## 2019-10-04 NOTE — Progress Notes (Signed)
Called RN to see if pt willing to do MRI - pt still refusing MRI and refused medications.
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
## 2019-10-04 NOTE — Progress Notes (Signed)
Orthopedic Tech Progress Note Patient Details:  Marvin Smith [birth year] [id number]  Ortho Devices Type of Ortho Device: Prafo boot/shoe Ortho Device/Splint Location: rle Ortho Device/Splint Interventions: Ordered, Application, Adjustment   Post Interventions Patient Tolerated: Well Instructions Provided: Care of device, Adjustment of device   Trinna Post 10/04/2019, 6:06 AM

## 2019-10-05 ENCOUNTER — Inpatient Hospital Stay (HOSPITAL_COMMUNITY): Payer: Worker's Compensation

## 2019-10-05 ENCOUNTER — Encounter (HOSPITAL_COMMUNITY): Payer: Self-pay

## 2019-10-05 DIAGNOSIS — R609 Edema, unspecified: Secondary | ICD-10-CM

## 2019-10-05 DIAGNOSIS — M7989 Other specified soft tissue disorders: Secondary | ICD-10-CM

## 2019-10-05 DIAGNOSIS — R52 Pain, unspecified: Secondary | ICD-10-CM

## 2019-10-05 LAB — URINALYSIS, ROUTINE W REFLEX MICROSCOPIC
Bilirubin Urine: NEGATIVE
Glucose, UA: NEGATIVE mg/dL
Ketones, ur: NEGATIVE mg/dL
Nitrite: NEGATIVE
Protein, ur: NEGATIVE mg/dL
RBC / HPF: >50 RBC/hpf — ABNORMAL HIGH (ref 0–5)
Specific Gravity, Urine: 1.011 (ref 1.005–1.030)
pH: 6 (ref 5.0–8.0)

## 2019-10-05 LAB — CBC
HCT: 25.1 % — ABNORMAL LOW (ref 39.0–52.0)
Hemoglobin: 8.2 g/dL — ABNORMAL LOW (ref 13.0–17.0)
MCH: 28.2 pg (ref 26.0–34.0)
MCHC: 32.7 g/dL (ref 30.0–36.0)
MCV: 86.3 fL (ref 80.0–100.0)
Platelets: 126 10*3/uL — ABNORMAL LOW (ref 150–400)
RBC: 2.91 MIL/uL — ABNORMAL LOW (ref 4.22–5.81)
RDW: 14.1 % (ref 11.5–15.5)
WBC: 9.3 10*3/uL (ref 4.0–10.5)
nRBC: 0 % (ref 0.0–0.2)

## 2019-10-05 LAB — BASIC METABOLIC PANEL
Anion gap: 9 (ref 5–15)
BUN: 10 mg/dL (ref 6–20)
CO2: 26 mmol/L (ref 22–32)
Calcium: 8.5 mg/dL — ABNORMAL LOW (ref 8.9–10.3)
Chloride: 101 mmol/L (ref 98–111)
Creatinine, Ser: 0.94 mg/dL (ref 0.61–1.24)
GFR calc Af Amer: >60 mL/min (ref >60)
GFR calc non Af Amer: >60 mL/min (ref >60)
Glucose, Bld: 105 mg/dL — ABNORMAL HIGH (ref 70–99)
Potassium: 3.6 mmol/L (ref 3.5–5.1)
Sodium: 136 mmol/L (ref 135–145)

## 2019-10-05 MED ORDER — PREGABALIN 75 MG PO CAPS
75.0000 mg | ORAL_CAPSULE | Freq: Two times a day (BID) | ORAL | Status: DC
  Administered 2019-10-05 (×2): 75 mg via ORAL
  Filled 2019-10-05 (×2): qty 1

## 2019-10-05 MED ORDER — DOCUSATE SODIUM 100 MG PO CAPS
100.0000 mg | ORAL_CAPSULE | Freq: Two times a day (BID) | ORAL | Status: DC
  Administered 2019-10-05 – 2019-10-11 (×12): 100 mg via ORAL
  Filled 2019-10-05 (×13): qty 1

## 2019-10-05 MED ORDER — BOOST / RESOURCE BREEZE PO LIQD CUSTOM
1.0000 | Freq: Three times a day (TID) | ORAL | Status: DC
  Administered 2019-10-05 – 2019-10-07 (×5): 1 via ORAL
  Administered 2019-10-09: 237 mL via ORAL
  Administered 2019-10-09: 1 via ORAL

## 2019-10-05 MED ORDER — POLYETHYLENE GLYCOL 3350 17 G PO PACK
17.0000 g | PACK | Freq: Every day | ORAL | Status: DC
  Administered 2019-10-05: 17 g via ORAL
  Filled 2019-10-05: qty 1

## 2019-10-05 MED ORDER — HYDROMORPHONE HCL 1 MG/ML IJ SOLN
0.5000 mg | INTRAMUSCULAR | Status: DC | PRN
  Administered 2019-10-06: 0.5 mg via INTRAVENOUS
  Filled 2019-10-05: qty 1

## 2019-10-05 MED ORDER — ENOXAPARIN SODIUM 30 MG/0.3ML ~~LOC~~ SOLN
30.0000 mg | Freq: Two times a day (BID) | SUBCUTANEOUS | Status: DC
  Administered 2019-10-05 – 2019-10-11 (×14): 30 mg via SUBCUTANEOUS
  Filled 2019-10-05 (×15): qty 0.3
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
## 2019-10-05 NOTE — Progress Notes (Signed)
Bilateral lower Ext. study completed.   See CVProc for preliminary results.   Kayshawn Ozburn, RDMS, RVT 
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
## 2019-10-05 NOTE — Progress Notes (Addendum)
   10/05/19 2009  Assess: MEWS Score  Temp 99.1 F (37.3 C)  BP (!) 145/82  Pulse Rate (!) 112  ECG Heart Rate (!) 113  Resp (!) 24  Level of Consciousness Alert  SpO2 100 %  O2 Device Room Air  Assess: MEWS Score  MEWS Temp 0  MEWS Systolic 0  MEWS Pulse 2  MEWS RR 1  MEWS LOC 0  MEWS Score 3  MEWS Score Color Yellow  Assess: if the MEWS score is Yellow or Red  Were vital signs taken at a resting state? Yes  Focused Assessment No change from prior assessment  Early Detection of Sepsis Score *See Row Information* Low  MEWS guidelines implemented *See Row Information* No, previously yellow, continue vital signs every 4 hours  Treat  MEWS Interventions Administered scheduled meds/treatments  Pain Scale 0-10  Pain Score 10  Pain Type Acute pain  Pain Location Leg  Pain Orientation Right  Pain Descriptors / Indicators Discomfort;Shooting;Throbbing  Pain Frequency Constant  Patients Stated Pain Goal 3  Patient mews previous red and yellow during day for heart rate and elevated temperature.Patient remains alert and oriented x 4 but continues to have pain 10/10 despite scheduled and prn pain medication given.Charge nurse Annabelle Harman aware and rapid response nurse Mindy notified on 2200 rounds.Will not escalate but continue to monitor every four hours and prn.

## 2019-10-05 NOTE — Care Management (Addendum)
Updated [employer]ner[employer] broadcasting[employer]film[employer] , Jonne Ply RN Phone [phone] fax [phone].  Jonne Ply will check and see if insurance company will cover inpatient rehab in [place].    Jonne Ply returned call, they will approve CIR in [place]. Ronny Flurry RN

## 2019-10-05 NOTE — Progress Notes (Signed)
PT Cancellation Note  Patient Details Name: Marvin Smith MRN: [id number] DOB: 04/19/1995   Cancelled Treatment:    Reason Eval/Treat Not Completed: Pain limiting ability to participate; attempted twice to see pt this pm, but reported in pain from mobilizing OOB earlier today.  Reports able to mobilize to drop arm recliner with very little assistance.  PT to follow up another day.   [name] 10/05/2019, 5:39 PM  Sheran Lawless, PT Acute Rehabilitation Services Pager:[phone] Office:[phone] 10/05/2019

## 2019-10-05 NOTE — Progress Notes (Signed)
Orthopedic Tech Progress Note Patient Details:  Marvin Smith 04-Dec-1995 [id number]  Delivered Abdominal Binder to pt   Post Interventions Patient Tolerated: Well Instructions Provided: Care of device, Adjustment of device   Marvin Smith 10/05/2019, 11:57 AM

## 2019-10-05 NOTE — Progress Notes (Signed)
RN went to check on patient for rounding and patient compaining of tightness in his foot so ortho boot was loosened and at patients request removed.  Pt was wiggling toes at this time and repositioned.  Pt told NT that he was unable to feel his right foot as well.  RN and NT went in to check on patient.  RN did neuro assessment and patient was able to identify that RN was touching his right foot with his eyes closed.  Pt still stated that he "cannot feel his foot." RN pinched nail bed of right foot and patient said ouch and withdrew his foot from my hand.  Patient had dorsiflextion in right foot and able to move toes.  Patient was unable to perform plantar flexion at that time but did respond to Babinski reflex.

## 2019-10-05 NOTE — Progress Notes (Deleted)
°   10/05/19 2009  Assess: MEWS Score  Temp 99.1 F (37.3 C)  BP (!) 145/82  Pulse Rate (!) 112  ECG Heart Rate (!) 113  Resp (!) 24  Level of Consciousness Alert  SpO2 100 %  O2 Device Room Air
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
## 2019-10-05 NOTE — Plan of Care (Signed)
°  Problem: Education: Goal: Knowledge of General Education information will improve Description: Including pain rating scale, medication(s)/side effects and non-pharmacologic comfort measures Outcome: Progressing   Problem: Clinical Measurements: Goal: Cardiovascular complication will be avoided Outcome: Progressing   Problem: Nutrition: Goal: Adequate nutrition will be maintained Outcome: Progressing   Problem: Elimination: Goal: Will not experience complications related to urinary retention Outcome: Progressing   Pt had c/o pain throughout the shift, mostly located on RLE and R foot. Sister at bedside. Bath today, sat in recliner (maxislide, NWB BLE) for 30 minutes. Back to bed with OT after placing scrotal sling. Scrotum softball size this AM, by end of shift scrotal edema increased to cantaloupe size. Encouraged scrotal sling, educated importance; pt stated painful. Penile edema noted. Boot placed on R foot and this RN did several stretching and dorsiflexion exercises with pt today. Urine sent from SP catheter per order d/t increased temperature today, TMax 102.4, scheduled tylenol assisted. New regimen for pain/nerve control seems to be slowly improving--lyrica and ativan scheduled with toradol and tramadol. Bleeding at [facility] cath site noted, changed split guaze dressing 5x this shift, serosangious this AM, by midshift frank red blood oozing. Encouraged patient towards marathon attitude, good spirits by end of shift, very grateful for the care. VSS. Will continue to monitor.

## 2019-10-06 LAB — BASIC METABOLIC PANEL
Anion gap: 7 (ref 5–15)
BUN: 10 mg/dL (ref 6–20)
CO2: 28 mmol/L (ref 22–32)
Calcium: 8.7 mg/dL — ABNORMAL LOW (ref 8.9–10.3)
Chloride: 103 mmol/L (ref 98–111)
Creatinine, Ser: 0.84 mg/dL (ref 0.61–1.24)
GFR calc Af Amer: >60 mL/min (ref >60)
GFR calc non Af Amer: >60 mL/min (ref >60)
Glucose, Bld: 121 mg/dL — ABNORMAL HIGH (ref 70–99)
Potassium: 3.7 mmol/L (ref 3.5–5.1)
Sodium: 138 mmol/L (ref 135–145)

## 2019-10-06 LAB — BLOOD CULTURE ID PANEL (REFLEXED) - BCID2

## 2019-10-06 LAB — BPAM FFP
Blood Product Expiration Date: 202109132359
Blood Product Expiration Date: 202109132359
Blood Product Expiration Date: 202109132359
Blood Product Expiration Date: 202109132359
Unit Type and Rh: 5100
Unit Type and Rh: 5100
Unit Type and Rh: 5100
Unit Type and Rh: 5100

## 2019-10-06 LAB — PREPARE FRESH FROZEN PLASMA
Unit division: 0
Unit division: 0

## 2019-10-06 LAB — CBC
HCT: 26.1 % — ABNORMAL LOW (ref 39.0–52.0)
Hemoglobin: 8.5 g/dL — ABNORMAL LOW (ref 13.0–17.0)
MCH: 28.2 pg (ref 26.0–34.0)
MCHC: 32.6 g/dL (ref 30.0–36.0)
MCV: 86.7 fL (ref 80.0–100.0)
Platelets: 172 10*3/uL (ref 150–400)
RBC: 3.01 MIL/uL — ABNORMAL LOW (ref 4.22–5.81)
RDW: 14.4 % (ref 11.5–15.5)
WBC: 9 10*3/uL (ref 4.0–10.5)
nRBC: 0 % (ref 0.0–0.2)

## 2019-10-06 MED ORDER — METHOCARBAMOL 500 MG PO TABS
1000.0000 mg | ORAL_TABLET | Freq: Three times a day (TID) | ORAL | Status: DC
  Administered 2019-10-06 – 2019-10-12 (×16): 1000 mg via ORAL
  Filled 2019-10-06 (×17): qty 2

## 2019-10-06 MED ORDER — HYDROMORPHONE HCL 1 MG/ML IJ SOLN
0.5000 mg | INTRAMUSCULAR | Status: DC | PRN
  Administered 2019-10-06 (×2): 0.5 mg via INTRAVENOUS
  Filled 2019-10-06 (×2): qty 1

## 2019-10-06 MED ORDER — VANCOMYCIN HCL 10 G IV SOLR
2500.0000 mg | Freq: Once | INTRAVENOUS | Status: AC
  Administered 2019-10-06: 2500 mg via INTRAVENOUS
  Filled 2019-10-06: qty 2500

## 2019-10-06 MED ORDER — VANCOMYCIN HCL 1250 MG/250ML IV SOLN
1250.0000 mg | Freq: Three times a day (TID) | INTRAVENOUS | Status: DC
  Administered 2019-10-06 – 2019-10-08 (×5): 1250 mg via INTRAVENOUS
  Filled 2019-10-06 (×6): qty 250

## 2019-10-06 MED ORDER — POLYETHYLENE GLYCOL 3350 17 G PO PACK
17.0000 g | PACK | Freq: Two times a day (BID) | ORAL | Status: DC
  Administered 2019-10-06 – 2019-10-11 (×8): 17 g via ORAL
  Filled 2019-10-06 (×11): qty 1

## 2019-10-06 MED ORDER — PREGABALIN 75 MG PO CAPS
150.0000 mg | ORAL_CAPSULE | Freq: Two times a day (BID) | ORAL | Status: DC
  Administered 2019-10-06 – 2019-10-12 (×13): 150 mg via ORAL
  Filled 2019-10-06 (×13): qty 2
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
## 2019-10-06 NOTE — Progress Notes (Signed)
Patient refused scrotal sling,prafo boot and scd's this shift.Patient verbalized they make his pain worse.

## 2019-10-06 NOTE — Progress Notes (Signed)
[facility] IP rehab admissions - Noted patient is interested in rehab in [place].  Currently rehab beds at [facility] are full today with limited bed availability all week.  I will follow at a distance.  [phone]
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
## 2019-10-07 LAB — CBC
HCT: 27.5 % — ABNORMAL LOW (ref 39.0–52.0)
Hemoglobin: 8.8 g/dL — ABNORMAL LOW (ref 13.0–17.0)
MCH: 27.7 pg (ref 26.0–34.0)
MCHC: 32 g/dL (ref 30.0–36.0)
MCV: 86.5 fL (ref 80.0–100.0)
Platelets: 217 10*3/uL (ref 150–400)
RBC: 3.18 MIL/uL — ABNORMAL LOW (ref 4.22–5.81)
RDW: 14.4 % (ref 11.5–15.5)
WBC: 8.2 10*3/uL (ref 4.0–10.5)
nRBC: 0 % (ref 0.0–0.2)

## 2019-10-07 LAB — BASIC METABOLIC PANEL
Anion gap: 7 (ref 5–15)
BUN: 10 mg/dL (ref 6–20)
CO2: 28 mmol/L (ref 22–32)
Calcium: 8.6 mg/dL — ABNORMAL LOW (ref 8.9–10.3)
Chloride: 105 mmol/L (ref 98–111)
Creatinine, Ser: 0.86 mg/dL (ref 0.61–1.24)
GFR calc Af Amer: >60 mL/min (ref >60)
GFR calc non Af Amer: >60 mL/min (ref >60)
Glucose, Bld: 108 mg/dL — ABNORMAL HIGH (ref 70–99)
Potassium: 3.7 mmol/L (ref 3.5–5.1)
Sodium: 140 mmol/L (ref 135–145)

## 2019-10-07 MED ORDER — HYDROMORPHONE HCL 1 MG/ML IJ SOLN: 0.5000 mg | Freq: Four times a day (QID) | INTRAMUSCULAR | Status: DC | PRN

## 2019-10-07 MED ORDER — BISACODYL 10 MG RE SUPP
10.0000 mg | Freq: Once | RECTAL | Status: DC
  Filled 2019-10-07 (×2): qty 1

## 2019-10-07 NOTE — Progress Notes (Signed)
Pt complained to RN that his right foot "felt more numb" after RN gave him scheduled robaxin and tramadol. RN explained he had received these medications before and had not had this reaction but they could be held if her prefer. RN passed along this info to night RN.

## 2019-10-07 NOTE — Progress Notes (Signed)
Mews score yellow for elevated heart rate. Pain currently in pain 9/10. Patient is at rest. Mews score has been yellow prior to this. Alert, no distress, prn pain meds and scheduled given.

## 2019-10-07 NOTE — TOC Progression Note (Signed)
Transition of Care [facility]) - Progression Note    Patient Details  Name: Marvin Smith MRN: [id number] Date of Birth: 1995-12-05  Transition of [facility]) CM/SW Contact  Lawerance Sabal, RN Phone Number: 10/07/2019, 11:50 AM  Clinical Narrative:    Discussed IR in [place], [facility] chosen. Worker's Comp has authorized [employer].  Referral faxed to [facility] ph [phone] at [facility]. Per MD will be ready for DC at end of week.  [facility] expects to have bed available by Friday. Rep to come visit patient at the bedside today.      Expected Discharge Plan: IP Rehab Facility Barriers to Discharge: Other (comment), Continued Medical Work up ([facility] [employer], bed availability,)  Expected Discharge Plan and Services Expected Discharge Plan: IP Rehab Facility     Post Acute Care Choice: Skilled Nursing Facility                                         Social Determinants of Health (SDOH) Interventions    Readmission Risk Interventions No flowsheet data found.
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
## 2019-10-07 NOTE — Plan of Care (Signed)
  Problem: Clinical Measurements: Goal: Ability to maintain clinical measurements within normal limits will improve Outcome: Progressing   Problem: Clinical Measurements: Goal: Will remain free from infection Outcome: Progressing   Problem: Clinical Measurements: Goal: Diagnostic test results will improve Outcome: Progressing   Problem: Activity: Goal: Risk for activity intolerance will decrease Outcome: Progressing   
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
## 2019-10-08 LAB — BASIC METABOLIC PANEL
Anion gap: 8 (ref 5–15)
BUN: 10 mg/dL (ref 6–20)
CO2: 26 mmol/L (ref 22–32)
Calcium: 8.7 mg/dL — ABNORMAL LOW (ref 8.9–10.3)
Chloride: 103 mmol/L (ref 98–111)
Creatinine, Ser: 0.88 mg/dL (ref 0.61–1.24)
GFR calc Af Amer: >60 mL/min (ref >60)
GFR calc non Af Amer: >60 mL/min (ref >60)
Glucose, Bld: 106 mg/dL — ABNORMAL HIGH (ref 70–99)
Potassium: 4 mmol/L (ref 3.5–5.1)
Sodium: 137 mmol/L (ref 135–145)

## 2019-10-08 LAB — CULTURE, BLOOD (ROUTINE X 2)
Special Requests: ADEQUATE
Special Requests: ADEQUATE

## 2019-10-08 LAB — VANCOMYCIN, TROUGH: Vancomycin Tr: 27 ug/mL (ref 15–20)

## 2019-10-08 LAB — CBC
HCT: 26.9 % — ABNORMAL LOW (ref 39.0–52.0)
Hemoglobin: 8.8 g/dL — ABNORMAL LOW (ref 13.0–17.0)
MCH: 28.2 pg (ref 26.0–34.0)
MCHC: 32.7 g/dL (ref 30.0–36.0)
MCV: 86.2 fL (ref 80.0–100.0)
Platelets: 273 10*3/uL (ref 150–400)
RBC: 3.12 MIL/uL — ABNORMAL LOW (ref 4.22–5.81)
RDW: 14.2 % (ref 11.5–15.5)
WBC: 9.1 10*3/uL (ref 4.0–10.5)
nRBC: 0 % (ref 0.0–0.2)

## 2019-10-08 LAB — URINE CULTURE: Culture: NO GROWTH

## 2019-10-08 MED ORDER — MAGNESIUM CITRATE PO SOLN
0.5000 | Freq: Once | ORAL | Status: AC
  Administered 2019-10-08: 0.5 via ORAL
  Filled 2019-10-08: qty 296

## 2019-10-08 MED ORDER — TRAZODONE HCL 50 MG PO TABS
25.0000 mg | ORAL_TABLET | Freq: Every day | ORAL | Status: DC
  Administered 2019-10-08 – 2019-10-11 (×4): 25 mg via ORAL
  Filled 2019-10-08 (×4): qty 1

## 2019-10-08 MED ORDER — LINEZOLID 600 MG PO TABS
600.0000 mg | ORAL_TABLET | Freq: Two times a day (BID) | ORAL | Status: DC
  Administered 2019-10-08 – 2019-10-12 (×9): 600 mg via ORAL
  Filled 2019-10-08 (×11): qty 1

## 2019-10-08 NOTE — Progress Notes (Signed)
CRITICAL VALUE ALERT  Critical Value:  Vancomycin of 27  Date & Time Notied:  9/16 at 0545  Provider Notified:  Donell Beers MD  Orders Received/Actions taken:   Pharmacy called noted that they will put orders in to redraw trough and have oncoming nurse hold vancomycin until lab is taken.
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
## 2019-10-08 NOTE — Discharge Instructions (Addendum)
Please take antibiotics to completion.  Your CT revealed the left parotid nodule of concern is most likely a benign parotid lymph node. Please monitor for enlargments or changes at home. You can follow up with Ear, Nose and Throat MD for this.    Weightbearing: Nonweightbearing bilateral lower extremities. Will be slide board transfer to wheelchair only. Insicional and dressing care: Ok to leave incisions open to air Orthopedic device(s): Abdominal binder as needed to assist with discomfort. PRAFO to right lower extremity to keep foot and dorsiflex position.   Suprapubic Catheter Home Guide A suprapubic catheter is a flexible tube that is used to drain urine from the bladder into a collection bag outside the body. The catheter is inserted into the bladder through a small opening in the lower abdomen, above the pubic bone (suprapubic area) and a few inches below your belly button (navel). A tiny balloon filled with germ-free (sterile) water helps to keep the catheter in place. The collection bag must be emptied at least once a day and cleaned at least every other day. The collection bag can be put beside your bed at night and attached to your leg during the day. You may have a large collection bag to use at night and a smaller one to use during the day. Your suprapubic catheter may need to be changed every 4-6 weeks, or as often as recommended by your health care provider. Healing of the tract where the catheter is placed can take 6 weeks to 6 months. During that time, your health care provider may change your catheter. Once the tract is well healed, you or a caregiver will change your suprapubic catheter at home. What are the risks? This catheter is safe to use. However, problems can occur, including:  Blocked urine flow. This can occur if the catheter stops working, or if you have a blood clot in your bladder or in the catheter.  Irritation of the skin around the catheter.  Infection. This can  happen if bacteria gets into your bladder. Supplies needed:  Two pairs of sterile gloves.  Paper towels.  Catheter.  Two syringes.  Sterile water.  Sterile cleaning solution.  Lubricant.  Collection bags. How to change the catheter  1. Drink plenty of fluids during the hours before you change the catheter. 2. Wash your hands with soap and water. If soap and water are not available, use hand sanitizer. 3. Draw up sterile water into a syringe to have ready to fill the new catheter balloon. The amount will depend on the size of the balloon. 4. Have all of your supplies ready and close to you on a paper towel. 5. Lie on your back, sitting slightly upright so that you can see the catheter and opening. 6. Put on sterile gloves. 7. Clean the skin around the catheter opening using the sterile cleaning solution. 8. Remove the water from the balloon in the catheter using a syringe. 9. Slowly remove the catheter. If the catheter seems stuck, or if you have difficulty removing it: ? Do not pull on it. ? Call your health care provider right away. 10. Place the old catheter on a paper towel to discard later. 11. Take off the used gloves, and put on a new pair. 12. Put lubricant on the end of the new catheter that will go into your bladder. 13. Clean the skin around the catheter opening using the sterile cleaning solution. 14. Gently slide the catheter through the opening in your abdomen and into  the tract that leads to your bladder. 15. Wait for some urine to start flowing through the catheter. 16. When urine starts to flow through the catheter, attach the collection bag to the end of the catheter. Make sure the connection is tight. 17. Use a syringe to fill the catheter balloon with sterile water. Fill to the amount directed by your health care provider. 18. Remove the gloves and wash your hands with soap and water. How to care for the skin around the catheter Follow your health care  provider's instructions on caring for your skin.  Use a clean washcloth and soapy water to clean the skin around your catheter every day. Pat the area dry with a clean paper towel.  Do not pull on the catheter.  Do not use ointment or lotion on this area, unless told by your health care provider.  Check the skin around the catheter every day for signs of infection. Check for: ? Redness, swelling, or pain. ? Fluid or blood. ? Warmth. ? Pus or a bad smell. How to empty and clean the collection bag Empty the large collection bag every 8 hours. Empty the small collection bag when it is about ? full. Clean the collection bag every 2-3 days, or as often as told by your health care provider. To do this: 1. Wash your hands with soap and water. If soap and water are not available, use hand sanitizer. 2. Disconnect the bag from the catheter and immediately attach a new bag to the catheter. 3. Hold the used bag over the toilet or another container. 4. Turn the valve (spigot) at the bottom of the bag to empty the urine. Empty the used bag completely. ? Do not touch the opening of the spigot. ? Do not let the opening touch the toilet or container. 5. Close the spigot tightly when the bag is empty. 6. Clean the used bag in one of the following methods: ? According to the manufacturer's instructions. ? As told by your health care provider. 7. Let the bag dry completely. Put it in a clean plastic bag before storing it. General tips   Always wash your hands before and after caring for your catheter and collection bag. Use a mild, fragrance-free soap. If soap and water are not available, use hand sanitizer.  Clean the outside of the catheter with soap and water as often as told by your health care provider.  Always make sure there are no twists or kinks in the catheter tube.  Always make sure there are no leaks in the catheter or collection bag.  Always wear the leg bag below your knee.  Make  sure the overnight drainage bag is always lower than the level of your bladder, but do not let it touch the floor. Before you go to sleep, hang the bag inside a wastebasket that is covered by a clean plastic bag.  Drink enough fluid to keep your urine pale yellow.  Do not take baths, swim, or use a hot tub until your health care provider approves. Ask your health care provider if you may take showers. Contact a heath care provider if:  You leak urine.  You have redness, swelling, or pain around your catheter.  You have fluid or blood coming from your catheter opening.  Your catheter opening feels warm to the touch.  You have pus or a bad smell coming from your catheter opening.  You have a fever or chills.  Your urine flow slows down.  Your urine becomes cloudy or smelly. Get help right away if:  Your catheter comes out.  You have: ? Nausea. ? Back pain. ? Difficulty changing your catheter. ? Blood in your urine. ? No urine flow for 1 hour. Summary  A suprapubic catheter is a flexible tube that is used to drain urine from the bladder into a collection bag outside the body.  Your suprapubic catheter may need to be changed every 4-6 weeks, or as recommended by your health care provider.  Follow instructions on how to change the catheter and how to empty and clean the collection bag.  Always wash your hands before and after caring for your catheter and collection bag. Drink enough fluid to keep your urine pale yellow.  Get help right away if you have difficulty changing your catheter or if there is blood in your urine. This information is not intended to replace advice given to you by your health care provider. Make sure you discuss any questions you have with your health care provider. Document Revised: 05/01/2018 Document Reviewed: 02/12/2018 Elsevier Patient Education  2020 [employer].
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
## 2019-10-08 NOTE — Progress Notes (Signed)
Inpatient Rehab Admissions Coordinator:   I met with Pt. And family member at bedside. Per chart, Pt. Has been accepted at [facility] in [place] with plans to d/c at the end of this week. Pt. Confirmed this plan. CIR will sign off.   Clemens Catholic, [place], [place] Admissions Coordinator  [phone] ([place]) [phone] (office)
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
## 2019-10-08 NOTE — Progress Notes (Signed)
Patient refused scheduled robaxin and tramadol both times on this shift. Patient stated that the last time he took both medications it made his foot numb.    Denver Faster, RN
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
## 2019-10-09 MED ORDER — CHLORHEXIDINE GLUCONATE CLOTH 2 % EX PADS
6.0000 | MEDICATED_PAD | Freq: Every day | CUTANEOUS | Status: DC
  Administered 2019-10-09 – 2019-10-12 (×4): 6 via TOPICAL
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
## 2019-10-09 NOTE — TOC Progression Note (Addendum)
Transition of Care (TOC) - Progression Note  Donn Pierini RN,BSN Transitions of Care Unit 4NP (non trauma) - RN Case Manager See Treatment Team for direct Phone # Covering Trauma  Patient Details  Name: Marvin Smith MRN: [id number] Date of Birth: 15-Oct-1995  Transition of Care [facility]) CM/SW Contact  Zenda Alpers, Lenn Sink, RN Phone Number: 10/09/2019, 11:30 AM  Clinical Narrative:    Per Trauma team- pt not medically ready for transition to [facility] INPT rehab today- will plan for Monday- Sept 20 - have spoken with Caryl Never at [facility] who states they will have bed available and will check in Monday for pt readiness.  1500- update - request from trauma team for PCP- call made to patient in room to discuss- pt has [facility] primary- per pt he would like to see if [facility] taking any new pt. - does not have a preference for provider there- if they are not then patient states he is fine with looking into any of the [facility] practices or [facility]. Will call any see about f/u appointment for patient with primary care.    Expected Discharge Plan: IP Rehab Facility Barriers to Discharge: Other (comment), Continued Medical Work up ([facility], bed availability,)  Expected Discharge Plan and Services Expected Discharge Plan: IP Rehab Facility     Post Acute Care Choice: Skilled Nursing Facility                                         Social Determinants of Health (SDOH) Interventions    Readmission Risk Interventions No flowsheet data found.

## 2019-10-10 LAB — BASIC METABOLIC PANEL
Anion gap: 9 (ref 5–15)
BUN: 10 mg/dL (ref 6–20)
CO2: 24 mmol/L (ref 22–32)
Calcium: 9.1 mg/dL (ref 8.9–10.3)
Chloride: 104 mmol/L (ref 98–111)
Creatinine, Ser: 0.87 mg/dL (ref 0.61–1.24)
GFR calc Af Amer: >60 mL/min (ref >60)
GFR calc non Af Amer: >60 mL/min (ref >60)
Glucose, Bld: 126 mg/dL — ABNORMAL HIGH (ref 70–99)
Potassium: 4.5 mmol/L (ref 3.5–5.1)
Sodium: 137 mmol/L (ref 135–145)

## 2019-10-10 LAB — CBC
HCT: 31.6 % — ABNORMAL LOW (ref 39.0–52.0)
Hemoglobin: 9.9 g/dL — ABNORMAL LOW (ref 13.0–17.0)
MCH: 27.2 pg (ref 26.0–34.0)
MCHC: 31.3 g/dL (ref 30.0–36.0)
MCV: 86.8 fL (ref 80.0–100.0)
Platelets: 440 10*3/uL — ABNORMAL HIGH (ref 150–400)
RBC: 3.64 MIL/uL — ABNORMAL LOW (ref 4.22–5.81)
RDW: 14.3 % (ref 11.5–15.5)
WBC: 11.2 10*3/uL — ABNORMAL HIGH (ref 4.0–10.5)
nRBC: 0 % (ref 0.0–0.2)
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
## 2019-10-10 NOTE — Plan of Care (Signed)
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
## 2019-10-11 LAB — CBC
HCT: 33.7 % — ABNORMAL LOW (ref 39.0–52.0)
Hemoglobin: 10.7 g/dL — ABNORMAL LOW (ref 13.0–17.0)
MCH: 27.2 pg (ref 26.0–34.0)
MCHC: 31.8 g/dL (ref 30.0–36.0)
MCV: 85.5 fL (ref 80.0–100.0)
Platelets: 495 10*3/uL — ABNORMAL HIGH (ref 150–400)
RBC: 3.94 MIL/uL — ABNORMAL LOW (ref 4.22–5.81)
RDW: 14.3 % (ref 11.5–15.5)
WBC: 13.6 10*3/uL — ABNORMAL HIGH (ref 4.0–10.5)
nRBC: 0 % (ref 0.0–0.2)

## 2019-10-11 LAB — BASIC METABOLIC PANEL
Anion gap: 9 (ref 5–15)
BUN: 11 mg/dL (ref 6–20)
CO2: 23 mmol/L (ref 22–32)
Calcium: 9.6 mg/dL (ref 8.9–10.3)
Chloride: 101 mmol/L (ref 98–111)
Creatinine, Ser: 0.85 mg/dL (ref 0.61–1.24)
GFR calc Af Amer: >60 mL/min (ref >60)
GFR calc non Af Amer: >60 mL/min (ref >60)
Glucose, Bld: 116 mg/dL — ABNORMAL HIGH (ref 70–99)
Potassium: 4.8 mmol/L (ref 3.5–5.1)
Sodium: 133 mmol/L — ABNORMAL LOW (ref 135–145)

## 2019-10-11 MED ORDER — MAGNESIUM CITRATE PO SOLN
1.0000 | Freq: Once | ORAL | Status: AC
  Administered 2019-10-11: 1 via ORAL
  Filled 2019-10-11: qty 296

## 2019-10-11 MED ORDER — KETOROLAC TROMETHAMINE 15 MG/ML IJ SOLN
15.0000 mg | Freq: Four times a day (QID) | INTRAMUSCULAR | Status: DC
  Administered 2019-10-11 – 2019-10-12 (×3): 15 mg via INTRAVENOUS
  Filled 2019-10-11 (×5): qty 1

## 2019-10-11 MED ORDER — BISACODYL 10 MG RE SUPP: 10.0000 mg | Freq: Once | RECTAL | Status: DC

## 2019-10-11 MED ORDER — DEXTROSE 5 % IV SOLN
3.0000 g | INTRAVENOUS | Status: AC
  Administered 2019-10-12: 3 g via INTRAVENOUS
  Filled 2019-10-11 (×2): qty 3000
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
## 2019-10-11 NOTE — Anesthesia Preprocedure Evaluation (Addendum)
Anesthesia Evaluation  Patient identified by MRN, date of birth, ID band Patient awake    Reviewed: Allergy & Precautions, H&P , NPO status , Patient's Chart, lab work & pertinent test results  Airway Mallampati: II  TM Distance: >3 FB Neck ROM: Full    Dental no notable dental hx. (+) Teeth Intact, Dental Advisory Given   Pulmonary neg pulmonary ROS,    Pulmonary exam normal breath sounds clear to auscultation       Cardiovascular Exercise Tolerance: Good negative cardio ROS   Rhythm:Regular Rate:Normal     Neuro/Psych negative neurological ROS  negative psych ROS   GI/Hepatic negative GI ROS, Neg liver ROS,   Endo/Other  negative endocrine ROS  Renal/GU negative Renal ROS  negative genitourinary   Musculoskeletal   Abdominal   Peds  Hematology negative hematology ROS (+)   Anesthesia Other Findings   Reproductive/Obstetrics negative OB ROS                            Anesthesia Physical Anesthesia Plan  ASA: II  Anesthesia Plan: General   Post-op Pain Management:    Induction: Intravenous  PONV Risk Score and Plan: 3 and Ondansetron, Dexamethasone and Midazolam  Airway Management Planned: Oral ETT  Additional Equipment:   Intra-op Plan:   Post-operative Plan: Extubation in OR  Informed Consent: I have reviewed the patients History and Physical, chart, labs and discussed the procedure including the risks, benefits and alternatives for the proposed anesthesia with the patient or authorized representative who has indicated his/her understanding and acceptance.     Dental advisory given  Plan Discussed with: CRNA  Anesthesia Plan Comments:        Anesthesia Quick Evaluation  

## 2019-10-11 NOTE — Plan of Care (Signed)

## 2019-10-12 ENCOUNTER — Inpatient Hospital Stay (HOSPITAL_COMMUNITY): Payer: Worker's Compensation | Admitting: Anesthesiology

## 2019-10-12 ENCOUNTER — Encounter (HOSPITAL_COMMUNITY): Payer: Self-pay

## 2019-10-12 ENCOUNTER — Inpatient Hospital Stay (HOSPITAL_COMMUNITY): Payer: Worker's Compensation

## 2019-10-12 ENCOUNTER — Encounter (HOSPITAL_COMMUNITY): Admission: EM | Disposition: A | Discharge status: Rehabilitation Facility | Payer: Self-pay | Source: Home / Self Care

## 2019-10-12 DIAGNOSIS — S0232XA Fracture of orbital floor, left side, initial encounter for closed fracture: Secondary | ICD-10-CM

## 2019-10-12 HISTORY — PX: ORIF ORBITAL FRACTURE: SHX5312

## 2019-10-12 SURGERY — OPEN REDUCTION INTERNAL FIXATION (ORIF) ORBITAL FRACTURE
Anesthesia: General | Site: Eye | Laterality: Left

## 2019-10-12 MED ORDER — ROCURONIUM BROMIDE 10 MG/ML (PF) SYRINGE
PREFILLED_SYRINGE | INTRAVENOUS | Status: DC | PRN
  Administered 2019-10-12: 60 mg via INTRAVENOUS

## 2019-10-12 MED ORDER — DEXAMETHASONE SODIUM PHOSPHATE 10 MG/ML IJ SOLN
INTRAMUSCULAR | Status: DC | PRN
  Administered 2019-10-12: 10 mg via INTRAVENOUS

## 2019-10-12 MED ORDER — FENTANYL CITRATE (PF) 250 MCG/5ML IJ SOLN
INTRAMUSCULAR | Status: AC
  Filled 2019-10-12: qty 5

## 2019-10-12 MED ORDER — ROCURONIUM BROMIDE 10 MG/ML (PF) SYRINGE
PREFILLED_SYRINGE | INTRAVENOUS | Status: AC
  Filled 2019-10-12: qty 10

## 2019-10-12 MED ORDER — MIDAZOLAM HCL 5 MG/5ML IJ SOLN
INTRAMUSCULAR | Status: DC | PRN
  Administered 2019-10-12: 2 mg via INTRAVENOUS

## 2019-10-12 MED ORDER — BUPIVACAINE HCL (PF) 0.25 % IJ SOLN
INTRAMUSCULAR | Status: DC | PRN
  Administered 2019-10-12: .1 mL

## 2019-10-12 MED ORDER — PROPOFOL 1000 MG/100ML IV EMUL
INTRAVENOUS | Status: AC
  Filled 2019-10-12: qty 100

## 2019-10-12 MED ORDER — IOHEXOL 300 MG/ML  SOLN
75.0000 mL | Freq: Once | INTRAMUSCULAR | Status: AC | PRN
  Administered 2019-10-12: 75 mL via INTRAVENOUS

## 2019-10-12 MED ORDER — DEXAMETHASONE SODIUM PHOSPHATE 10 MG/ML IJ SOLN
INTRAMUSCULAR | Status: AC
  Filled 2019-10-12: qty 1

## 2019-10-12 MED ORDER — ONDANSETRON HCL 4 MG/2ML IJ SOLN
INTRAMUSCULAR | Status: AC
  Filled 2019-10-12: qty 2

## 2019-10-12 MED ORDER — LINEZOLID 600 MG PO TABS
600.0000 mg | ORAL_TABLET | Freq: Two times a day (BID) | ORAL | 0 refills | Status: DC
Start: 2019-10-12 — End: 2019-11-13

## 2019-10-12 MED ORDER — OXYCODONE HCL 10 MG PO TABS: 10.0000 mg | ORAL_TABLET | Freq: Four times a day (QID) | ORAL | 0 refills | Status: DC | PRN

## 2019-10-12 MED ORDER — SUGAMMADEX SODIUM 200 MG/2ML IV SOLN
INTRAVENOUS | Status: DC | PRN
  Administered 2019-10-12: 300 mg via INTRAVENOUS

## 2019-10-12 MED ORDER — LIDOCAINE-EPINEPHRINE 1 %-1:100000 IJ SOLN
INTRAMUSCULAR | Status: AC
  Filled 2019-10-12: qty 1

## 2019-10-12 MED ORDER — BUPIVACAINE HCL (PF) 0.25 % IJ SOLN
INTRAMUSCULAR | Status: AC
  Filled 2019-10-12: qty 30

## 2019-10-12 MED ORDER — ONDANSETRON HCL 4 MG/2ML IJ SOLN
INTRAMUSCULAR | Status: DC | PRN
  Administered 2019-10-12: 4 mg via INTRAVENOUS

## 2019-10-12 MED ORDER — SUCCINYLCHOLINE CHLORIDE 200 MG/10ML IV SOSY
PREFILLED_SYRINGE | INTRAVENOUS | Status: AC
  Filled 2019-10-12: qty 10

## 2019-10-12 MED ORDER — ASCORBIC ACID 1000 MG PO TABS: 1000.0000 mg | ORAL_TABLET | Freq: Every day | ORAL | Status: AC

## 2019-10-12 MED ORDER — TOBRAMYCIN 0.3 % OP OINT
TOPICAL_OINTMENT | OPHTHALMIC | Status: DC | PRN
  Administered 2019-10-12: 1 via OPHTHALMIC

## 2019-10-12 MED ORDER — HYDROMORPHONE HCL 1 MG/ML IJ SOLN: 0.2500 mg | INTRAMUSCULAR | Status: DC | PRN

## 2019-10-12 MED ORDER — LIDOCAINE-EPINEPHRINE 1 %-1:100000 IJ SOLN
INTRAMUSCULAR | Status: DC | PRN
  Administered 2019-10-12: 1 mL

## 2019-10-12 MED ORDER — VITAMIN D (ERGOCALCIFEROL) 1.25 MG (50000 UNIT) PO CAPS: 50000.0000 [IU] | ORAL_CAPSULE | ORAL | Status: AC

## 2019-10-12 MED ORDER — PROPOFOL 10 MG/ML IV BOLUS
INTRAVENOUS | Status: DC | PRN
  Administered 2019-10-12: 150 mg via INTRAVENOUS

## 2019-10-12 MED ORDER — TOBRAMYCIN-DEXAMETHASONE 0.3-0.1 % OP SUSP
OPHTHALMIC | Status: AC
  Filled 2019-10-12: qty 2.5

## 2019-10-12 MED ORDER — LIDOCAINE 2% (20 MG/ML) 5 ML SYRINGE
INTRAMUSCULAR | Status: DC | PRN
  Administered 2019-10-12: 60 mg via INTRAVENOUS

## 2019-10-12 MED ORDER — SODIUM CHLORIDE 0.9% FLUSH: 10.0000 mL | INTRAVENOUS | Status: AC | PRN

## 2019-10-12 MED ORDER — PROPOFOL 10 MG/ML IV BOLUS
INTRAVENOUS | Status: AC
  Filled 2019-10-12: qty 20

## 2019-10-12 MED ORDER — DIPHENHYDRAMINE HCL 50 MG/ML IJ SOLN
INTRAMUSCULAR | Status: AC
  Filled 2019-10-12: qty 1

## 2019-10-12 MED ORDER — OXYMETAZOLINE HCL 0.05 % NA SOLN
NASAL | Status: AC
  Filled 2019-10-12: qty 30

## 2019-10-12 MED ORDER — TOBRAMYCIN-DEXAMETHASONE 0.3-0.1 % OP OINT
TOPICAL_OINTMENT | OPHTHALMIC | Status: AC
  Filled 2019-10-12: qty 3.5

## 2019-10-12 MED ORDER — SODIUM CHLORIDE 0.9% FLUSH: 10.0000 mL | Freq: Two times a day (BID) | INTRAVENOUS | Status: AC

## 2019-10-12 MED ORDER — CEFAZOLIN SODIUM 1 G IJ SOLR
INTRAMUSCULAR | Status: AC
  Filled 2019-10-12: qty 30

## 2019-10-12 MED ORDER — DOCUSATE SODIUM 100 MG PO CAPS
100.0000 mg | ORAL_CAPSULE | Freq: Two times a day (BID) | ORAL | 0 refills | Status: DC | PRN
Start: 2019-10-12 — End: 2019-11-13

## 2019-10-12 MED ORDER — MIDAZOLAM HCL 2 MG/2ML IJ SOLN
INTRAMUSCULAR | Status: AC
  Filled 2019-10-12: qty 2

## 2019-10-12 MED ORDER — ENOXAPARIN SODIUM 30 MG/0.3ML ~~LOC~~ SOLN: 30.0000 mg | Freq: Two times a day (BID) | SUBCUTANEOUS | Status: DC

## 2019-10-12 MED ORDER — EPHEDRINE 5 MG/ML INJ
INTRAVENOUS | Status: AC
  Filled 2019-10-12: qty 10

## 2019-10-12 MED ORDER — ACETAMINOPHEN 500 MG PO TABS
1000.0000 mg | ORAL_TABLET | Freq: Four times a day (QID) | ORAL | 0 refills | Status: DC
Start: 2019-10-12 — End: 2019-11-13

## 2019-10-12 MED ORDER — PHENYLEPHRINE 40 MCG/ML (10ML) SYRINGE FOR IV PUSH (FOR BLOOD PRESSURE SUPPORT)
PREFILLED_SYRINGE | INTRAVENOUS | Status: AC
  Filled 2019-10-12: qty 10

## 2019-10-12 MED ORDER — FENTANYL CITRATE (PF) 250 MCG/5ML IJ SOLN
INTRAMUSCULAR | Status: DC | PRN
Start: 2019-10-12 — End: 2019-10-12
  Administered 2019-10-12: 50 ug via INTRAVENOUS
  Administered 2019-10-12: 100 ug via INTRAVENOUS

## 2019-10-12 MED ORDER — LIDOCAINE 2% (20 MG/ML) 5 ML SYRINGE
INTRAMUSCULAR | Status: AC
  Filled 2019-10-12: qty 5

## 2019-10-12 MED ORDER — VITAMIN D3 25 MCG PO TABS: 2000.0000 [IU] | ORAL_TABLET | Freq: Two times a day (BID) | ORAL | Status: AC

## 2019-10-12 MED ORDER — IBUPROFEN 600 MG PO TABS: 600.0000 mg | ORAL_TABLET | Freq: Three times a day (TID) | ORAL | Status: DC | PRN

## 2019-10-12 MED ORDER — BACITRACIN ZINC 500 UNIT/GM EX OINT
TOPICAL_OINTMENT | CUTANEOUS | Status: AC
  Filled 2019-10-12: qty 28.35

## 2019-10-12 MED ORDER — PREGABALIN 150 MG PO CAPS: 150.0000 mg | ORAL_CAPSULE | Freq: Two times a day (BID) | ORAL | Status: DC

## 2019-10-12 MED ORDER — METHOCARBAMOL 500 MG PO TABS
1000.0000 mg | ORAL_TABLET | Freq: Three times a day (TID) | ORAL | 0 refills | Status: DC
Start: 2019-10-12 — End: 2019-11-13

## 2019-10-12 MED ORDER — LACTATED RINGERS IV SOLN: INTRAVENOUS | Status: DC | PRN

## 2019-10-12 SURGICAL SUPPLY — 47 items
BLADE CLIPPER SURG (BLADE) IMPLANT
BLADE SURG 15 STRL LF DISP TIS (BLADE) ×1 IMPLANT
BLADE SURG 15 STRL SS (BLADE) ×2
CANISTER SUCT 3000ML PPV (MISCELLANEOUS) ×3 IMPLANT
CLEANER TIP ELECTROSURG 2X2 (MISCELLANEOUS) ×3 IMPLANT
CLOSURE WOUND 1/2 X4 (GAUZE/BANDAGES/DRESSINGS) ×1
COVER SURGICAL LIGHT HANDLE (MISCELLANEOUS) ×3 IMPLANT
COVER WAND RF STERILE (DRAPES) ×3 IMPLANT
DECANTER SPIKE VIAL GLASS SM (MISCELLANEOUS) ×3 IMPLANT
ELECT COATED BLADE 2.86 ST (ELECTRODE) ×3 IMPLANT
ELECT NDL BLADE 2-5/6 (NEEDLE) IMPLANT
ELECT NDL TIP 2.8 STRL (NEEDLE) ×1 IMPLANT
ELECT NEEDLE BLADE 2-5/6 (NEEDLE) ×3 IMPLANT
ELECT NEEDLE TIP 2.8 STRL (NEEDLE) ×3 IMPLANT
ELECT REM PT RETURN 9FT ADLT (ELECTROSURGICAL) ×3
ELECTRODE REM PT RTRN 9FT ADLT (ELECTROSURGICAL) ×1 IMPLANT
GLOVE BIO SURGEON STRL SZ 6.5 (GLOVE) ×2 IMPLANT
GLOVE BIO SURGEONS STRL SZ 6.5 (GLOVE) ×1
GOWN STRL REUS W/ TWL LRG LVL3 (GOWN DISPOSABLE) ×2 IMPLANT
GOWN STRL REUS W/TWL LRG LVL3 (GOWN DISPOSABLE) ×4
KIT BASIN OR (CUSTOM PROCEDURE TRAY) ×3 IMPLANT
KIT TURNOVER KIT B (KITS) ×3 IMPLANT
NDL HYPO 30X.5 LL (NEEDLE) ×1 IMPLANT
NDL PRECISIONGLIDE 27X1.5 (NEEDLE) ×1 IMPLANT
NEEDLE HYPO 30X.5 LL (NEEDLE) ×3 IMPLANT
NEEDLE PRECISIONGLIDE 27X1.5 (NEEDLE) ×3 IMPLANT
NS IRRIG 1000ML POUR BTL (IV SOLUTION) ×3 IMPLANT
PAD ARMBOARD 7.5X6 YLW CONV (MISCELLANEOUS) ×6 IMPLANT
PENCIL BUTTON HOLSTER BLD 10FT (ELECTRODE) ×3 IMPLANT
SCISSORS WIRE ANG 4 3/4 DISP (INSTRUMENTS) ×3 IMPLANT
SPACER ACF PARAELL 6MM (Bone Implant) ×2 IMPLANT
SPONGE LAP 18X18 RF (DISPOSABLE) ×2 IMPLANT
STRIP CLOSURE SKIN 1/2X4 (GAUZE/BANDAGES/DRESSINGS) ×1 IMPLANT
SUT ETHILON 4 0 CL P 3 (SUTURE) IMPLANT
SUT PROLENE 6 0 PC 1 (SUTURE) IMPLANT
SUT SILK 4 0 P 3 (SUTURE) ×2 IMPLANT
SUT STEEL 0 (SUTURE)
SUT STEEL 0 18XMFL TIE 17 (SUTURE) IMPLANT
SUT STEEL 2 (SUTURE) IMPLANT
SUT VIC AB 4-0 PS2 18 (SUTURE) ×2 IMPLANT
SUT VIC AB 5-0 RB1 27 (SUTURE) ×6 IMPLANT
SUT VICRYL 4-0 PS2 18IN ABS (SUTURE) IMPLANT
TOWEL GREEN STERILE (TOWEL DISPOSABLE) ×3 IMPLANT
TOWEL GREEN STERILE FF (TOWEL DISPOSABLE) ×3 IMPLANT
TRAY ENT MC OR (CUSTOM PROCEDURE TRAY) ×3 IMPLANT
TRAY FOLEY MTR SLVR 14FR STAT (SET/KITS/TRAYS/PACK) IMPLANT
WATER STERILE IRR 1000ML POUR (IV SOLUTION) ×3 IMPLANT

## 2019-10-12 NOTE — Interval H&P Note (Signed)
History and Physical Interval Note:  10/12/2019 7:10 AM  Marvin Smith  has presented today for surgery, with the diagnosis of orbital floor fracture.  The various methods of treatment have been discussed with the patient and family. After consideration of risks, benefits and other options for treatment, the patient has consented to  Procedure(s): OPEN REDUCTION INTERNAL FIXATION (ORIF) LEFT ORBITAL FRACTURE (Left) as a surgical intervention.  The patient's history has been reviewed, patient examined, no change in status, stable for surgery.  I have reviewed the patient's chart and labs.  Questions were answered to the patient's satisfaction.     Alena Bills Awab Abebe

## 2019-10-12 NOTE — TOC Transition Note (Signed)
Transition of Care (TOC) - CM/SW Discharge Note Donn Pierini RN,BSN Transitions of Care Unit 4NP (non trauma) - RN Case Manager See Treatment Team for direct Phone #   Patient Details  Name: Marvin Smith MRN: [id number] Date of Birth: 1995/03/09  Transition of Care [facility]) CM/SW Contact:  Darrold Span, RN Phone Number: 10/12/2019, 2:17 PM   Clinical Narrative:    Pt went to OR this am and has been cleared by trauma team for transition to INPT rehab at [facility] in w/s- have spoken with Jace at [facility] and confirmed bed available today for pt transition to INPT rehab there. Pt will d/c later today and transport via [facility].  Receiving MD at [facility]- Dr. Alison Murray- [facility] will update bed assignment on arrival  RN to call report to Nursing Station- [phone]  1330- [facility] called for transport- per dispatch should be here within the hour for transport- paper work place on shadow chart. [place] to be completed by PA/RN prior to discharge.   Have faxed today's op note and d/c summary to [facility] ([phone]), and call made to worker's comp CM- Jonne Ply to update- [phone]) will also fax d/c summary to [facility]- (fax [phone])   Final next level of care: IP Rehab Facility Barriers to Discharge: Barriers Resolved   Patient Goals and CMS Choice Patient states their goals for this hospitalization and ongoing recovery are:: [facility] IR CMS Medicare.gov Compare Post Acute Care list provided to:: Patient Choice offered to / list presented to : Patient  Discharge Placement               INPT rehab- [facility]        Discharge Plan and Services   Discharge Planning Services: CM Consult Post Acute Care Choice: Skilled Nursing Facility                               Social Determinants of Health (SDOH) Interventions     Readmission Risk Interventions Readmission Risk Prevention Plan 10/12/2019  Post Dischage Appt Complete  Medication Screening Complete   Transportation Screening Complete
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
## 2019-10-12 NOTE — Anesthesia Postprocedure Evaluation (Signed)
Anesthesia Post Note  Patient: Engineer, materials  Procedure(s) Performed: OPEN REDUCTION INTERNAL FIXATION (ORIF) LEFT ORBITAL FRACTURE (Left Eye)     Patient location during evaluation: PACU Anesthesia Type: General Level of consciousness: awake and alert Pain management: pain level controlled Vital Signs Assessment: post-procedure vital signs reviewed and stable Respiratory status: spontaneous breathing, nonlabored ventilation and respiratory function stable Cardiovascular status: blood pressure returned to baseline and stable Postop Assessment: no apparent nausea or vomiting Anesthetic complications: no   No complications documented.  Last Vitals:  Vitals:   10/12/19 0900 10/12/19 0904  BP:  (!) 148/87  Pulse: (!) 109 (!) 110  Resp: (!) 21 (!) 24  Temp: 36.5 C 36.9 C  SpO2: 100%     Last Pain:  Vitals:   10/12/19 0904  TempSrc: Oral  PainSc: 8                  Avah Bashor,W. EDMOND

## 2019-10-12 NOTE — Anesthesia Procedure Notes (Signed)
Procedure Name: Intubation Date/Time: 10/12/2019 7:32 AM Performed by: Myna Bright, CRNA Pre-anesthesia Checklist: Patient identified, Emergency Drugs available, Suction available and Patient being monitored Patient Re-evaluated:Patient Re-evaluated prior to induction Oxygen Delivery Method: Circle system utilized Preoxygenation: Pre-oxygenation with 100% oxygen Induction Type: IV induction Ventilation: Mask ventilation without difficulty Laryngoscope Size: Mac and 4 Grade View: Grade I Tube type: Oral Tube size: 7.5 mm Number of attempts: 1 Airway Equipment and Method: Stylet Placement Confirmation: ETT inserted through vocal cords under direct vision,  positive ETCO2 and breath sounds checked- equal and bilateral Secured at: 22 cm Tube secured with: Tape Dental Injury: Teeth and Oropharynx as per pre-operative assessment

## 2019-10-12 NOTE — Transfer of Care (Signed)
Immediate Anesthesia Transfer of Care Note  Patient: Marvin Smith  Procedure(s) Performed: OPEN REDUCTION INTERNAL FIXATION (ORIF) LEFT ORBITAL FRACTURE (Left Eye)  Patient Location: PACU  Anesthesia Type:General  Level of Consciousness: sedated and patient cooperative  Airway & Oxygen Therapy: Patient Spontanous Breathing and Patient connected to nasal cannula oxygen  Post-op Assessment: Report given to RN, Post -op Vital signs reviewed and stable and Patient moving all extremities  Post vital signs: Reviewed and stable  Last Vitals:  Vitals Value Taken Time  BP 148/82 10/12/19 0834  Temp    Pulse 114 10/12/19 0835  Resp 38 10/12/19 0835  SpO2 99 % 10/12/19 0835  Vitals shown include unvalidated device data.  Last Pain:  Vitals:   10/12/19 0516  TempSrc: Oral  PainSc:       Patients Stated Pain Goal: 2 (10/11/19 0302)  Complications: No complications documented.

## 2019-10-12 NOTE — Progress Notes (Signed)
Patient doesn't know what kind of surgery he is having so im sending down to the OR a blank consent form. And he is stable so I will take him off of the heart monitor while he is in transport.

## 2019-10-13 ENCOUNTER — Encounter (HOSPITAL_COMMUNITY): Payer: Self-pay | Admitting: Plastic Surgery

## 2019-10-15 LAB — CULTURE, BLOOD (SINGLE): Culture: NO GROWTH

## 2019-10-26 ENCOUNTER — Telehealth: Payer: Self-pay | Admitting: Orthopedic Surgery

## 2019-10-26 ENCOUNTER — Telehealth: Payer: Self-pay

## 2019-10-26 NOTE — Telephone Encounter (Signed)
Ronald Lobo [facility] nurse called stating pt's incision has been draining for the last 3 days  Her CB # [phone]

## 2019-10-26 NOTE — Telephone Encounter (Signed)
S/w her and advised per Dr August Saucer patient should follow up with Dr Jena Gauss office.

## 2019-10-27 NOTE — Telephone Encounter (Signed)
E 

## 2019-11-03 ENCOUNTER — Ambulatory Visit (INDEPENDENT_AMBULATORY_CARE_PROVIDER_SITE_OTHER): Payer: Worker's Compensation | Admitting: Surgical

## 2019-11-03 ENCOUNTER — Other Ambulatory Visit: Payer: Self-pay

## 2019-11-03 ENCOUNTER — Encounter: Payer: Self-pay | Admitting: Surgical

## 2019-11-03 VITALS — BP 112/74 | HR 115 | Temp 99.5°F

## 2019-11-03 DIAGNOSIS — S0232XD Fracture of orbital floor, left side, subsequent encounter for fracture with routine healing: Secondary | ICD-10-CM

## 2019-11-03 NOTE — Progress Notes (Signed)
Patient is a 24 year old male here for follow-up after open reduction internal fixation of left orbital floor fracture with Dr. Ulice Bold on 10/12/2019.  He reports that overall he is doing well in regards to his left eye, reports he did see an eye doctor for the double vision and they stated he needed to be seen by plastic surgeon.  He reports that he saw of his eye doctor just a few days after his surgery with our group and patient and his significant other report that he had had not had much improvement at that point.  Today he reports that he is overall doing well in regards to his eye, double vision has improved, but he does have some double vision remaining when looking to the right and up.  He does not have any blurry vision.  On exam he has normal EOMs of bilateral eyes, no deficits noted.  No conjunctival changes noted.  PERRL noted bilaterally.  No entrapment noted on exam.  Left eye incision intact, healing well, swelling has improved.  Discussed with patient that overall on exam everything appears to be healing well, there is no sign of entrapment with EOM exam.  I discussed with patient that I recommend he follow-up with ophthalmology for an additional evaluation to discuss diplopia.  I do not feel as if it is associated with entrapment due to him having full EOMs.  I discussed with the patient that in regards to follow-up, he can follow-up as needed -patient is in agreement with this plan.  If he has any questions or concerns, we can certainly see him back.

## 2019-11-05 ENCOUNTER — Telehealth: Payer: Self-pay | Admitting: Orthopedic Surgery

## 2019-11-05 NOTE — Telephone Encounter (Signed)
Cindy the [facility] adjuster would like for [place] to fax the appt notes for the pts last visit with Dr. Manon Hilding Fax# [phone]

## 2019-11-05 NOTE — Telephone Encounter (Signed)
Patient never saw Dr August Saucer in follow up. Her care was transferred to Dr Jena Gauss

## 2019-11-06 ENCOUNTER — Other Ambulatory Visit (HOSPITAL_COMMUNITY): Payer: Self-pay | Admitting: Urology

## 2019-11-06 DIAGNOSIS — S3739XS Other injury of urethra, sequela: Secondary | ICD-10-CM

## 2019-11-06 DIAGNOSIS — Z9359 Other cystostomy status: Secondary | ICD-10-CM

## 2019-11-10 ENCOUNTER — Other Ambulatory Visit: Payer: Self-pay

## 2019-11-10 ENCOUNTER — Ambulatory Visit: Payer: Self-pay | Admitting: Student

## 2019-11-10 ENCOUNTER — Ambulatory Visit (HOSPITAL_COMMUNITY)
Admission: RE | Admit: 2019-11-10 | Discharge: 2019-11-10 | Disposition: A | Discharge status: Home / Self Care | Payer: Managed Care, Other (non HMO) | Source: Ambulatory Visit | Attending: Urology | Admitting: Urology

## 2019-11-10 DIAGNOSIS — Z435 Encounter for attention to cystostomy: Secondary | ICD-10-CM | POA: Diagnosis not present

## 2019-11-10 DIAGNOSIS — S334XXA Traumatic rupture of symphysis pubis, initial encounter: Secondary | ICD-10-CM

## 2019-11-10 DIAGNOSIS — X58XXXS Exposure to other specified factors, sequela: Secondary | ICD-10-CM | POA: Insufficient documentation

## 2019-11-10 DIAGNOSIS — S3739XS Other injury of urethra, sequela: Secondary | ICD-10-CM | POA: Insufficient documentation

## 2019-11-10 DIAGNOSIS — T8149XA Infection following a procedure, other surgical site, initial encounter: Secondary | ICD-10-CM

## 2019-11-10 DIAGNOSIS — Z9359 Other cystostomy status: Secondary | ICD-10-CM

## 2019-11-10 HISTORY — PX: IR CATHETER TUBE CHANGE: IMG717

## 2019-11-10 MED ORDER — IOHEXOL 300 MG/ML  SOLN
50.0000 mL | Freq: Once | INTRAMUSCULAR | Status: AC | PRN
  Administered 2019-11-10: 8 mL

## 2019-11-10 MED ORDER — LIDOCAINE VISCOUS HCL 2 % MT SOLN
OROMUCOSAL | Status: AC
  Filled 2019-11-10: qty 15

## 2019-11-10 MED ORDER — LIDOCAINE HCL (PF) 1 % IJ SOLN
INTRAMUSCULAR | Status: AC
  Filled 2019-11-10: qty 30

## 2019-11-10 NOTE — Procedures (Signed)
Interventional Radiology Procedure Note  Procedure:   Replacement and upsize of supra-pubic catheter, now a 8F pigtail drain.   Complications: None  Recommendations:  - To gravity     Signed,  Yvone Neu. Loreta Ave, DO

## 2019-11-11 ENCOUNTER — Encounter (HOSPITAL_COMMUNITY): Payer: Self-pay | Admitting: Student

## 2019-11-11 ENCOUNTER — Other Ambulatory Visit: Payer: Self-pay

## 2019-11-11 NOTE — Anesthesia Preprocedure Evaluation (Addendum)
Anesthesia Evaluation  Patient identified by MRN, date of birth, ID band Patient awake    Reviewed: Allergy & Precautions, H&P , NPO status , Patient's Chart, lab work & pertinent test results  History of Anesthesia Complications Negative for: history of anesthetic complications  Airway Mallampati: II  TM Distance: >3 FB Neck ROM: Full    Dental no notable dental hx. (+) Dental Advisory Given   Pulmonary neg pulmonary ROS,    Pulmonary exam normal        Cardiovascular Exercise Tolerance: Good negative cardio ROS Normal cardiovascular exam     Neuro/Psych negative neurological ROS  negative psych ROS   GI/Hepatic negative GI ROS, Neg liver ROS,   Endo/Other  negative endocrine ROS  Renal/GU negative Renal ROS  negative genitourinary   Musculoskeletal Pot-op infection   Abdominal   Peds  Hematology negative hematology ROS (+)   Anesthesia Other Findings   Reproductive/Obstetrics negative OB ROS                            Anesthesia Physical  Anesthesia Plan  ASA: II  Anesthesia Plan: General   Post-op Pain Management:    Induction: Intravenous  PONV Risk Score and Plan: 3 and Ondansetron, Dexamethasone and Midazolam  Airway Management Planned: LMA  Additional Equipment:   Intra-op Plan:   Post-operative Plan: Extubation in OR  Informed Consent: I have reviewed the patients History and Physical, chart, labs and discussed the procedure including the risks, benefits and alternatives for the proposed anesthesia with the patient or authorized representative who has indicated his/her understanding and acceptance.     Dental advisory given  Plan Discussed with: Anesthesiologist and CRNA  Anesthesia Plan Comments:       Anesthesia Quick Evaluation

## 2019-11-11 NOTE — Progress Notes (Signed)
Spoke with WIfe Marvin Smith [phone] for PAT information.  PCP - Harriett Sine Cardiologist - n/a  Chest x-ray - 10/05/19 (1V) EKG - n/a Stress Test - n/a ECHO - n/a Cardiac Cath - n/a  STOP now taking any Aspirin (unless otherwise instructed by your surgeon), Aleve, Naproxen, Ibuprofen, Motrin, Advil, Goody's, BC's, all herbal medications, fish oil, and all vitamins.   Coronavirus Screening Covid test on DOS  Do you have any of the following symptoms:  Cough yes/no: No Fever (>100.76F)  yes/no: No Runny nose yes/no: No Sore throat yes/no: No Difficulty breathing/shortness of breath  yes/no: No  Have you traveled in the last 14 days and where? yes/no: No  Wife [name] verbalized understanding of instructions that were given via phone.

## 2019-11-12 ENCOUNTER — Other Ambulatory Visit: Payer: Self-pay

## 2019-11-12 ENCOUNTER — Encounter (HOSPITAL_COMMUNITY): Payer: Self-pay | Admitting: Student

## 2019-11-12 ENCOUNTER — Inpatient Hospital Stay (HOSPITAL_COMMUNITY): Payer: Worker's Compensation | Admitting: Anesthesiology

## 2019-11-12 ENCOUNTER — Inpatient Hospital Stay (HOSPITAL_COMMUNITY)
Admission: RE | Admit: 2019-11-12 | Discharge: 2019-11-13 | DRG: 857 | Disposition: A | Discharge status: Home Health | Payer: Worker's Compensation | Attending: Student | Admitting: Student

## 2019-11-12 ENCOUNTER — Inpatient Hospital Stay (HOSPITAL_COMMUNITY): Payer: Managed Care, Other (non HMO)

## 2019-11-12 ENCOUNTER — Encounter (HOSPITAL_COMMUNITY)
Admission: RE | Disposition: A | Discharge status: Home Health | Payer: Self-pay | Source: Home / Self Care | Attending: Student

## 2019-11-12 DIAGNOSIS — S3730XA Unspecified injury of urethra, initial encounter: Secondary | ICD-10-CM | POA: Diagnosis present

## 2019-11-12 DIAGNOSIS — Z79899 Other long term (current) drug therapy: Secondary | ICD-10-CM | POA: Diagnosis not present

## 2019-11-12 DIAGNOSIS — L089 Local infection of the skin and subcutaneous tissue, unspecified: Secondary | ICD-10-CM | POA: Diagnosis present

## 2019-11-12 DIAGNOSIS — T8141XA Infection following a procedure, superficial incisional surgical site, initial encounter: Secondary | ICD-10-CM | POA: Diagnosis present

## 2019-11-12 DIAGNOSIS — Y990 Civilian activity done for income or pay: Secondary | ICD-10-CM

## 2019-11-12 DIAGNOSIS — E559 Vitamin D deficiency, unspecified: Secondary | ICD-10-CM | POA: Diagnosis present

## 2019-11-12 DIAGNOSIS — S334XXA Traumatic rupture of symphysis pubis, initial encounter: Secondary | ICD-10-CM | POA: Insufficient documentation

## 2019-11-12 DIAGNOSIS — S32512A Fracture of superior rim of left pubis, initial encounter for closed fracture: Secondary | ICD-10-CM | POA: Diagnosis present

## 2019-11-12 DIAGNOSIS — Z20822 Contact with and (suspected) exposure to covid-19: Secondary | ICD-10-CM | POA: Diagnosis present

## 2019-11-12 DIAGNOSIS — Z960 Presence of urogenital implants: Secondary | ICD-10-CM | POA: Diagnosis present

## 2019-11-12 DIAGNOSIS — Z435 Encounter for attention to cystostomy: Secondary | ICD-10-CM | POA: Diagnosis not present

## 2019-11-12 DIAGNOSIS — Z419 Encounter for procedure for purposes other than remedying health state, unspecified: Secondary | ICD-10-CM

## 2019-11-12 DIAGNOSIS — T8149XA Infection following a procedure, other surgical site, initial encounter: Secondary | ICD-10-CM | POA: Insufficient documentation

## 2019-11-12 HISTORY — DX: Vitamin D deficiency, unspecified: E55.9

## 2019-11-12 HISTORY — DX: Dependence on wheelchair: Z99.3

## 2019-11-12 HISTORY — PX: I & D EXTREMITY: SHX5045

## 2019-11-12 HISTORY — PX: IRRIGATION AND DEBRIDEMENT ABSCESS: SHX5252

## 2019-11-12 HISTORY — DX: Presence of other specified devices: Z97.8

## 2019-11-12 LAB — CBC
HCT: 38.7 % — ABNORMAL LOW (ref 39.0–52.0)
Hemoglobin: 11.7 g/dL — ABNORMAL LOW (ref 13.0–17.0)
MCH: 26.4 pg (ref 26.0–34.0)
MCHC: 30.2 g/dL (ref 30.0–36.0)
MCV: 87.2 fL (ref 80.0–100.0)
Platelets: 292 10*3/uL (ref 150–400)
RBC: 4.44 MIL/uL (ref 4.22–5.81)
RDW: 14.9 % (ref 11.5–15.5)
WBC: 8.4 10*3/uL (ref 4.0–10.5)
nRBC: 0 % (ref 0.0–0.2)

## 2019-11-12 LAB — SARS CORONAVIRUS 2 BY RT PCR (HOSPITAL ORDER, PERFORMED IN ~~LOC~~ HOSPITAL LAB): SARS Coronavirus 2: NEGATIVE

## 2019-11-12 SURGERY — IRRIGATION AND DEBRIDEMENT EXTREMITY
Anesthesia: General | Site: Pelvis

## 2019-11-12 MED ORDER — SODIUM CHLORIDE 0.9 % IV SOLN
2.0000 g | INTRAVENOUS | Status: AC
  Administered 2019-11-12: 2 g via INTRAVENOUS
  Filled 2019-11-12: qty 20

## 2019-11-12 MED ORDER — METOCLOPRAMIDE HCL 5 MG/ML IJ SOLN: 5.0000 mg | Freq: Three times a day (TID) | INTRAMUSCULAR | Status: DC | PRN

## 2019-11-12 MED ORDER — METHOCARBAMOL 1000 MG/10ML IJ SOLN
500.0000 mg | Freq: Four times a day (QID) | INTRAVENOUS | Status: DC | PRN
  Filled 2019-11-12: qty 5

## 2019-11-12 MED ORDER — ONDANSETRON HCL 4 MG/2ML IJ SOLN
INTRAMUSCULAR | Status: DC | PRN
  Administered 2019-11-12: 4 mg via INTRAVENOUS

## 2019-11-12 MED ORDER — VANCOMYCIN HCL 1000 MG IV SOLR
INTRAVENOUS | Status: DC | PRN
  Administered 2019-11-12: 1000 mg via TOPICAL

## 2019-11-12 MED ORDER — CHLORHEXIDINE GLUCONATE 4 % EX LIQD: 60.0000 mL | Freq: Once | CUTANEOUS | Status: DC

## 2019-11-12 MED ORDER — CHLORHEXIDINE GLUCONATE 0.12 % MT SOLN
OROMUCOSAL | Status: AC
  Administered 2019-11-12: 15 mL
  Filled 2019-11-12: qty 15

## 2019-11-12 MED ORDER — FENTANYL CITRATE (PF) 100 MCG/2ML IJ SOLN
25.0000 ug | INTRAMUSCULAR | Status: DC | PRN
  Administered 2019-11-12: 25 ug via INTRAVENOUS

## 2019-11-12 MED ORDER — MIDAZOLAM HCL 5 MG/5ML IJ SOLN
INTRAMUSCULAR | Status: DC | PRN
  Administered 2019-11-12: 2 mg via INTRAVENOUS

## 2019-11-12 MED ORDER — POLYETHYLENE GLYCOL 3350 17 G PO PACK: 17.0000 g | PACK | Freq: Every day | ORAL | Status: DC | PRN

## 2019-11-12 MED ORDER — METHOCARBAMOL 500 MG PO TABS
500.0000 mg | ORAL_TABLET | Freq: Four times a day (QID) | ORAL | Status: DC | PRN
  Administered 2019-11-12: 500 mg via ORAL
  Filled 2019-11-12: qty 1

## 2019-11-12 MED ORDER — BACITRACIN ZINC 500 UNIT/GM EX OINT
TOPICAL_OINTMENT | CUTANEOUS | Status: AC
  Filled 2019-11-12: qty 28.35

## 2019-11-12 MED ORDER — 0.9 % SODIUM CHLORIDE (POUR BTL) OPTIME
TOPICAL | Status: DC | PRN
  Administered 2019-11-12: 1000 mL

## 2019-11-12 MED ORDER — POVIDONE-IODINE 10 % EX SWAB: 2.0000 "application " | Freq: Once | CUTANEOUS | Status: DC

## 2019-11-12 MED ORDER — PROMETHAZINE HCL 25 MG/ML IJ SOLN: 6.2500 mg | INTRAMUSCULAR | Status: DC | PRN

## 2019-11-12 MED ORDER — CELECOXIB 200 MG PO CAPS
200.0000 mg | ORAL_CAPSULE | Freq: Once | ORAL | Status: AC
  Administered 2019-11-12: 200 mg via ORAL
  Filled 2019-11-12: qty 1

## 2019-11-12 MED ORDER — FENTANYL CITRATE (PF) 100 MCG/2ML IJ SOLN
INTRAMUSCULAR | Status: DC | PRN
  Administered 2019-11-12: 50 ug via INTRAVENOUS
  Administered 2019-11-12: 100 ug via INTRAVENOUS
  Administered 2019-11-12 (×2): 25 ug via INTRAVENOUS

## 2019-11-12 MED ORDER — LIDOCAINE 2% (20 MG/ML) 5 ML SYRINGE
INTRAMUSCULAR | Status: DC | PRN
  Administered 2019-11-12: 100 mg via INTRAVENOUS

## 2019-11-12 MED ORDER — HYDROMORPHONE HCL 1 MG/ML IJ SOLN: 0.5000 mg | INTRAMUSCULAR | Status: DC | PRN

## 2019-11-12 MED ORDER — TRAZODONE HCL 50 MG PO TABS
50.0000 mg | ORAL_TABLET | Freq: Every day | ORAL | Status: DC
  Administered 2019-11-12: 50 mg via ORAL
  Filled 2019-11-12: qty 1

## 2019-11-12 MED ORDER — PROPOFOL 10 MG/ML IV BOLUS
INTRAVENOUS | Status: AC
  Filled 2019-11-12: qty 20

## 2019-11-12 MED ORDER — MIDAZOLAM HCL 2 MG/2ML IJ SOLN
INTRAMUSCULAR | Status: AC
  Filled 2019-11-12: qty 2

## 2019-11-12 MED ORDER — METOCLOPRAMIDE HCL 5 MG PO TABS: 5.0000 mg | ORAL_TABLET | Freq: Three times a day (TID) | ORAL | Status: DC | PRN

## 2019-11-12 MED ORDER — FENTANYL CITRATE (PF) 250 MCG/5ML IJ SOLN
INTRAMUSCULAR | Status: AC
  Filled 2019-11-12: qty 5

## 2019-11-12 MED ORDER — PHENYLEPHRINE HCL-NACL 10-0.9 MG/250ML-% IV SOLN
INTRAVENOUS | Status: DC | PRN
  Administered 2019-11-12: 35 ug/min via INTRAVENOUS

## 2019-11-12 MED ORDER — DEXAMETHASONE SODIUM PHOSPHATE 10 MG/ML IJ SOLN
INTRAMUSCULAR | Status: DC | PRN
  Administered 2019-11-12: 10 mg via INTRAVENOUS

## 2019-11-12 MED ORDER — ACETAMINOPHEN 325 MG PO TABS: 325.0000 mg | ORAL_TABLET | Freq: Four times a day (QID) | ORAL | Status: DC | PRN

## 2019-11-12 MED ORDER — TOBRAMYCIN SULFATE 1.2 G IJ SOLR
INTRAMUSCULAR | Status: AC
  Filled 2019-11-12: qty 1.2

## 2019-11-12 MED ORDER — CEFAZOLIN SODIUM-DEXTROSE 2-3 GM-%(50ML) IV SOLR
INTRAVENOUS | Status: DC | PRN
  Administered 2019-11-12: 2 g via INTRAVENOUS

## 2019-11-12 MED ORDER — FENTANYL CITRATE (PF) 100 MCG/2ML IJ SOLN
INTRAMUSCULAR | Status: AC
  Filled 2019-11-12: qty 2

## 2019-11-12 MED ORDER — ENOXAPARIN SODIUM 40 MG/0.4ML ~~LOC~~ SOLN
40.0000 mg | SUBCUTANEOUS | Status: DC
  Filled 2019-11-12: qty 0.4

## 2019-11-12 MED ORDER — ONDANSETRON HCL 4 MG PO TABS: 4.0000 mg | ORAL_TABLET | Freq: Four times a day (QID) | ORAL | Status: DC | PRN

## 2019-11-12 MED ORDER — ONDANSETRON HCL 4 MG/2ML IJ SOLN: 4.0000 mg | Freq: Four times a day (QID) | INTRAMUSCULAR | Status: DC | PRN

## 2019-11-12 MED ORDER — PHENYLEPHRINE 40 MCG/ML (10ML) SYRINGE FOR IV PUSH (FOR BLOOD PRESSURE SUPPORT)
PREFILLED_SYRINGE | INTRAVENOUS | Status: AC
  Filled 2019-11-12: qty 10

## 2019-11-12 MED ORDER — LACTATED RINGERS IV SOLN: INTRAVENOUS | Status: DC | PRN

## 2019-11-12 MED ORDER — OXYCODONE HCL 5 MG PO TABS
5.0000 mg | ORAL_TABLET | ORAL | Status: DC | PRN
  Administered 2019-11-12 – 2019-11-13 (×4): 10 mg via ORAL
  Filled 2019-11-12 (×4): qty 2

## 2019-11-12 MED ORDER — PREGABALIN 100 MG PO CAPS
200.0000 mg | ORAL_CAPSULE | Freq: Three times a day (TID) | ORAL | Status: DC
  Administered 2019-11-12 – 2019-11-13 (×3): 200 mg via ORAL
  Filled 2019-11-12 (×3): qty 2

## 2019-11-12 MED ORDER — CEFAZOLIN SODIUM-DEXTROSE 2-3 GM-%(50ML) IV SOLR: INTRAVENOUS | Status: DC | PRN

## 2019-11-12 MED ORDER — PROPOFOL 10 MG/ML IV BOLUS
INTRAVENOUS | Status: DC | PRN
  Administered 2019-11-12: 200 mg via INTRAVENOUS

## 2019-11-12 MED ORDER — ASCORBIC ACID 500 MG PO TABS
500.0000 mg | ORAL_TABLET | Freq: Two times a day (BID) | ORAL | Status: DC
  Administered 2019-11-12 – 2019-11-13 (×3): 500 mg via ORAL
  Filled 2019-11-12 (×3): qty 1

## 2019-11-12 MED ORDER — DOCUSATE SODIUM 100 MG PO CAPS
100.0000 mg | ORAL_CAPSULE | Freq: Two times a day (BID) | ORAL | Status: DC
  Filled 2019-11-12 (×2): qty 1

## 2019-11-12 MED ORDER — VANCOMYCIN HCL IN DEXTROSE 1-5 GM/200ML-% IV SOLN
1000.0000 mg | Freq: Two times a day (BID) | INTRAVENOUS | Status: AC
  Administered 2019-11-12 – 2019-11-13 (×2): 1000 mg via INTRAVENOUS
  Filled 2019-11-12 (×2): qty 200

## 2019-11-12 MED ORDER — ACETAMINOPHEN 500 MG PO TABS
1000.0000 mg | ORAL_TABLET | Freq: Once | ORAL | Status: AC
  Administered 2019-11-12: 1000 mg via ORAL
  Filled 2019-11-12: qty 2

## 2019-11-12 MED ORDER — VANCOMYCIN HCL 1000 MG IV SOLR
INTRAVENOUS | Status: AC
  Filled 2019-11-12: qty 1000

## 2019-11-12 SURGICAL SUPPLY — 50 items
ANCHOR CATH FOLEY SECURE (MISCELLANEOUS) ×3 IMPLANT
BNDG COHESIVE 4X5 TAN STRL (GAUZE/BANDAGES/DRESSINGS) IMPLANT
BNDG GAUZE ELAST 4 BULKY (GAUZE/BANDAGES/DRESSINGS) IMPLANT
BRUSH SCRUB EZ PLAIN DRY (MISCELLANEOUS) ×3 IMPLANT
CHLORAPREP W/TINT 26 (MISCELLANEOUS) ×3 IMPLANT
COVER MAYO STAND STRL (DRAPES) ×3 IMPLANT
COVER SURGICAL LIGHT HANDLE (MISCELLANEOUS) ×6 IMPLANT
COVER WAND RF STERILE (DRAPES) IMPLANT
DRAPE LAPAROSCOPIC ABDOMINAL (DRAPES) ×3 IMPLANT
DRAPE ORTHO SPLIT 77X108 STRL (DRAPES) ×2
DRAPE SURG 17X23 STRL (DRAPES) ×15 IMPLANT
DRAPE SURG ORHT 6 SPLT 77X108 (DRAPES) ×1 IMPLANT
DRAPE U-SHAPE 47X51 STRL (DRAPES) IMPLANT
DRSG ADAPTIC 3X8 NADH LF (GAUZE/BANDAGES/DRESSINGS) IMPLANT
DRSG MEPILEX BORDER 4X8 (GAUZE/BANDAGES/DRESSINGS) ×3 IMPLANT
ELECT REM PT RETURN 9FT ADLT (ELECTROSURGICAL) ×3
ELECTRODE REM PT RTRN 9FT ADLT (ELECTROSURGICAL) ×1 IMPLANT
EVACUATOR 1/8 PVC DRAIN (DRAIN) IMPLANT
GAUZE SPONGE 4X4 12PLY STRL (GAUZE/BANDAGES/DRESSINGS) IMPLANT
GLOVE BIO SURGEON STRL SZ 6.5 (GLOVE) ×6 IMPLANT
GLOVE BIO SURGEON STRL SZ7.5 (GLOVE) ×12 IMPLANT
GLOVE BIO SURGEONS STRL SZ 6.5 (GLOVE) ×3
GLOVE BIOGEL PI IND STRL 6.5 (GLOVE) ×1 IMPLANT
GLOVE BIOGEL PI IND STRL 7.5 (GLOVE) ×1 IMPLANT
GLOVE BIOGEL PI INDICATOR 6.5 (GLOVE) ×2
GLOVE BIOGEL PI INDICATOR 7.5 (GLOVE) ×2
GOWN STRL REUS W/ TWL LRG LVL3 (GOWN DISPOSABLE) ×2 IMPLANT
GOWN STRL REUS W/TWL LRG LVL3 (GOWN DISPOSABLE) ×4
HANDPIECE INTERPULSE COAX TIP (DISPOSABLE)
KIT BASIN OR (CUSTOM PROCEDURE TRAY) ×3 IMPLANT
KIT TURNOVER KIT B (KITS) ×3 IMPLANT
MANIFOLD NEPTUNE II (INSTRUMENTS) ×3 IMPLANT
NS IRRIG 1000ML POUR BTL (IV SOLUTION) ×3 IMPLANT
PACK ORTHO EXTREMITY (CUSTOM PROCEDURE TRAY) ×3 IMPLANT
PAD ARMBOARD 7.5X6 YLW CONV (MISCELLANEOUS) ×6 IMPLANT
PADDING CAST COTTON 6X4 STRL (CAST SUPPLIES) IMPLANT
SET HNDPC FAN SPRY TIP SCT (DISPOSABLE) IMPLANT
SPONGE LAP 18X18 RF (DISPOSABLE) ×3 IMPLANT
SUT ETHILON 2 0 FS 18 (SUTURE) IMPLANT
SUT ETHILON 3 0 PS 1 (SUTURE) ×3 IMPLANT
SUT MON AB 2-0 CT1 36 (SUTURE) ×3 IMPLANT
SUT PDS AB 0 CT 36 (SUTURE) IMPLANT
SWAB CULTURE ESWAB REG 1ML (MISCELLANEOUS) IMPLANT
TOWEL GREEN STERILE (TOWEL DISPOSABLE) ×6 IMPLANT
TOWEL GREEN STERILE FF (TOWEL DISPOSABLE) ×3 IMPLANT
TUBE CONNECTING 12'X1/4 (SUCTIONS) ×1
TUBE CONNECTING 12X1/4 (SUCTIONS) ×2 IMPLANT
UNDERPAD 30X36 HEAVY ABSORB (UNDERPADS AND DIAPERS) IMPLANT
WATER STERILE IRR 1000ML POUR (IV SOLUTION) IMPLANT
YANKAUER SUCT BULB TIP NO VENT (SUCTIONS) ×3 IMPLANT

## 2019-11-12 NOTE — Progress Notes (Signed)
Pt has been educated the importance of pulse ox monitoring and SCD, pt refused. Per Ortho tech, pt refused the trapeze bar.

## 2019-11-12 NOTE — Anesthesia Procedure Notes (Signed)
Procedure Name: LMA Insertion Date/Time: 11/12/2019 7:46 AM Performed by: Dorie Rank, CRNA Pre-anesthesia Checklist: Patient identified, Emergency Drugs available, Suction available, Patient being monitored and Timeout performed Preoxygenation: Pre-oxygenation with 100% oxygen Induction Type: IV induction Ventilation: Mask ventilation without difficulty LMA: LMA inserted LMA Size: 5.0 Number of attempts: 1 Placement Confirmation: positive ETCO2 and breath sounds checked- equal and bilateral Dental Injury: Teeth and Oropharynx as per pre-operative assessment
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
## 2019-11-12 NOTE — Transfer of Care (Signed)
Immediate Anesthesia Transfer of Care Note  Patient: Marvin Smith  Procedure(s) Performed: IRRIGATION AND DEBRIDEMENT ANTERIOR PELVIS (N/A Pelvis)  Patient Location: PACU  Anesthesia Type:General  Level of Consciousness: awake, alert  and sedated  Airway & Oxygen Therapy: Patient connected to face mask oxygen  Post-op Assessment: Post -op Vital signs reviewed and stable  Post vital signs: stable  Last Vitals:  Vitals Value Taken Time  BP 173/98 11/12/19 0909  Temp    Pulse 106 11/12/19 0908  Resp 26 11/12/19 0908  SpO2 100 % 11/12/19 0908  Vitals shown include unvalidated device data.  Last Pain:  Vitals:   11/12/19 0552  TempSrc: Oral         Complications: No complications documented.
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
## 2019-11-12 NOTE — Anesthesia Postprocedure Evaluation (Signed)
Anesthesia Post Note  Patient: Engineer, materials  Procedure(s) Performed: IRRIGATION AND DEBRIDEMENT ANTERIOR PELVIS (N/A Pelvis)     Patient location during evaluation: PACU Anesthesia Type: General Level of consciousness: sedated Pain management: pain level controlled Vital Signs Assessment: post-procedure vital signs reviewed and stable Respiratory status: spontaneous breathing and respiratory function stable Cardiovascular status: stable Postop Assessment: no apparent nausea or vomiting Anesthetic complications: no   No complications documented.  Last Vitals:  Vitals:   11/12/19 0953 11/12/19 1008  BP: (!) 150/98 (!) 147/99  Pulse: (!) 101 (!) 102  Resp: (!) 22 20  Temp:  36.6 C  SpO2: 97% 98%    Last Pain:  Vitals:   11/12/19 1008  TempSrc:   PainSc: Asleep                 Surabhi Gadea DANIEL

## 2019-11-12 NOTE — Progress Notes (Signed)
Received pt from PACU accompanied by staff nurse, Pt is, drowsy but arousable in no apparent distress. Orientated/situated to room/equipments with no complaints. Welcome guide/menu provided with instructions. Pt verbalized understanding of instructions. Valuable policy has been discussed. Hospital bed in lowest position with 3 side rails up,call bell/room phone within reach , and alll wheels locked.

## 2019-11-13 ENCOUNTER — Encounter (HOSPITAL_COMMUNITY): Payer: Self-pay | Admitting: Student

## 2019-11-13 MED ORDER — ASPIRIN EC 325 MG PO TBEC: 325.0000 mg | DELAYED_RELEASE_TABLET | Freq: Every day | ORAL | 0 refills | Status: AC

## 2019-11-13 MED ORDER — HYDROCODONE-ACETAMINOPHEN 5-325 MG PO TABS: 1.0000 | ORAL_TABLET | Freq: Four times a day (QID) | ORAL | 0 refills | Status: AC | PRN

## 2019-11-13 MED ORDER — LEVOFLOXACIN 500 MG PO TABS: 750.0000 mg | ORAL_TABLET | Freq: Every day | ORAL | 0 refills | Status: AC

## 2019-11-13 NOTE — Plan of Care (Signed)
Patient had I&D of anterior pelvis on 10/21. Patient has clean dry and intact dsg to anterior pelvis. Suprapubic catheter in place and draining WNL. Patient refuses continuous pulse ox but will accept SCDs. IV antibiotics given as ordered and pain controlled with PRN medications. IVF running KVO in left arm IV #18. Will continue to monitor and continue current POC.
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
## 2019-11-13 NOTE — Plan of Care (Signed)
  Problem: Pain Managment: Goal: General experience of comfort will improve Outcome: Progressing   
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
## 2019-11-13 NOTE — Discharge Summary (Signed)
Orthopaedic Trauma Service (OTS) Discharge Summary   Patient ID: Marvin Smith MRN: [id number] DOB/AGE: 25-03-1995 25 y.o.  Admit date: 11/12/2019 Discharge date: 11/13/2019  Admission Diagnoses: Persistent drainage from anterior pelvis incision  Discharge Diagnoses:  Active Problems:   Closed complete rupture of pubic symphysis   Postoperative wound infection   Wound infection   Past Medical History:  Diagnosis Date  . Foley catheter in place   . History of blood transfusion 09/30/2019  . Vitamin D deficiency   . Wheelchair bound      Procedures Performed: CPT 10180-Irrigation and debridement of postoperative pelvic infection  Discharged Condition: good  Hospital Course: Patient presented to [facility] on 11/12/2019 for scheduled surgical procedure.  Was taken to operating room by Dr. Jena Gauss and underwent the above procedure.  Tolerated this well without complications.  Intraoperative cultures were taken and patient was admitted overnight for IV antibiotics x24 hours.  Worked with physical and occupational therapy on afternoon of postop day 0.  Was started on Lovenox for DVT prophylaxis starting on postoperative day 1 On 11/13/2019, the patient was tolerating diet, working well with therapies, pain well controlled, vital signs stable, dressings clean, dry, intact and felt stable for discharge to  home. Patient will follow up as below and knows to call with questions or concerns.     Consults: None  Significant Diagnostic Studies:   Results for orders placed or performed during the hospital encounter of 11/12/19 (from the past 168 hour(s))  CBC   Collection Time: 11/12/19  5:52 AM  Result Value Ref Range   WBC 8.4 4.0 - 10.5 K/uL   RBC 4.44 4.22 - 5.81 MIL/uL   Hemoglobin 11.7 (L) 13.0 - 17.0 g/dL   HCT 46.8 (L) 39 - 52 %   MCV 87.2 80.0 - 100.0 fL   MCH 26.4 26.0 - 34.0 pg   MCHC 30.2 30.0 - 36.0 g/dL   RDW 03.2 12.2 - 48.2 %   Platelets 292  150 - 400 K/uL   nRBC 0.0 0.0 - 0.2 %  SARS Coronavirus 2 by RT PCR (hospital order, performed in [facility] hospital lab) Nasopharyngeal Nasopharyngeal Swab   Collection Time: 11/12/19  6:06 AM   Specimen: Nasopharyngeal Swab  Result Value Ref Range   SARS Coronavirus 2 NEGATIVE NEGATIVE  Aerobic/Anaerobic Culture (surgical/deep wound)   Collection Time: 11/12/19  8:18 AM   Specimen: Wound  Result Value Ref Range   Specimen Description WOUND    Special Requests PELVIC WOUND SPEC A    Gram Stain NO WBC SEEN NO ORGANISMS SEEN     Culture      NO GROWTH < 24 HOURS Performed at [facility], [address], [place], [place] [address]    Report Status PENDING      Treatments: IV hydration, antibiotics: vancomycin and ceftriaxone, analgesia: acetaminophen, Dilaudid and oxycodone, anticoagulation: LMW heparin, therapies: PT and OT and surgery: As above  Discharge Exam: General: Laying in bed, no acute distress Respiratory: No increased work of breathing at rest Pelvis: Dressing over anterior pelvis clean, dry, intact.  No significant tenderness about this area. Suprapubic catheter in place with some mild purulence around it. Patient has 3 out of 5 quad extension and is able to hold it against gravity. No active ankle dorsiflexion or toe extension some flicker of plantarflexion. Intact toe flexion and intact hamstring flexion. Left lower extremity is within normal limits.  Disposition: Discharge disposition: 01-Home or Self Care  Allergies as of 11/13/2019   No Known Allergies     Medication List    STOP taking these medications   acetaminophen 500 MG tablet Commonly known as: TYLENOL   acetaminophen 650 MG CR tablet Commonly known as: TYLENOL   docusate sodium 100 MG capsule Commonly known as: COLACE   enoxaparin 30 MG/0.3ML injection Commonly known as: LOVENOX   ibuprofen 600 MG tablet Commonly known as: ADVIL   linezolid 600 MG tablet Commonly  known as: ZYVOX   methocarbamol 500 MG tablet Commonly known as: ROBAXIN     TAKE these medications   ascorbic acid 1000 MG tablet Commonly known as: VITAMIN C Take 1 tablet (1,000 mg total) by mouth daily. What changed:   how much to take  when to take this   aspirin EC 325 MG tablet Take 1 tablet (325 mg total) by mouth daily.   clotrimazole-betamethasone cream Commonly known as: LOTRISONE Apply 1 application topically daily.   HYDROcodone-acetaminophen 5-325 MG tablet Commonly known as: NORCO/VICODIN Take 1 tablet by mouth every 6 (six) hours as needed for severe pain.   levofloxacin 500 MG tablet Commonly known as: Levaquin Take 1.5 tablets (750 mg total) by mouth daily for 7 days.   pregabalin 100 MG capsule Commonly known as: LYRICA Take 200 mg by mouth in the morning, at noon, and at bedtime. What changed: Another medication with the same name was removed. Continue taking this medication, and follow the directions you see here.   sodium chloride flush 0.9 % Soln Commonly known as: NS 10-40 mLs by Intracatheter route every 12 (twelve) hours.   sodium chloride flush 0.9 % Soln Commonly known as: NS 10-40 mLs by Intracatheter route as needed (flush).   traZODone 50 MG tablet Commonly known as: DESYREL Take 50 mg by mouth at bedtime.   Vitamin D (Ergocalciferol) 1.25 MG (50000 UNIT) Caps capsule Commonly known as: DRISDOL Take 1 capsule (50,000 Units total) by mouth every Sunday at 6pm.   Vitamin D3 25 MCG tablet Commonly known as: Vitamin D Take 2 tablets (2,000 Units total) by mouth 2 (two) times daily.       Follow-up Information    Medicine, [facility]. Schedule an appointment as soon as possible for a visit.   Specialty: Family Medicine Why: Please follow up with your primary care physician in the next 7-10 days after discharge from the hospital. Contact information: [address] [place]  [address] [phone]        Care, [facility] Follow up.   Specialty: Home Health Services Why: You will continue to receive RN home health services through [facility].  They will call you in the next 24-48 hours to follow up with times for home health. Contact information: [address] [place] [address] [phone]        Roby Lofts, MD. Schedule an appointment as soon as possible for a visit in 2 week(s).   Specialty: Orthopedic Surgery Why: suture removal Contact information: [address] [place] [address] [phone]               Discharge Instructions and Plan: Patient will be discharged to home. Will be discharged on Aspirin for DVT prophylaxis. Patient already has all the necessary DME needed for discharge. Patient will follow up with Dr. Jena Gauss in 2 weeks for wound check and suture removal.   Signed:  Shawn Route. Ladonna Snide ?[phone]? (phone) 11/13/2019, 9:14  AM  [facility] [address] [place] [address] [phone] (F)

## 2019-11-13 NOTE — Progress Notes (Signed)
Discharge paperwork and instructions given to pt. Per PA-C, pt is not to shower until tomorrow, dressing can be changed tomorrow. Pt not in distress and tolerated well.

## 2019-11-18 LAB — AEROBIC/ANAEROBIC CULTURE W GRAM STAIN (SURGICAL/DEEP WOUND): Gram Stain: NONE SEEN

## 2019-12-29 ENCOUNTER — Other Ambulatory Visit (HOSPITAL_COMMUNITY): Payer: Self-pay | Admitting: Urology

## 2019-12-29 ENCOUNTER — Other Ambulatory Visit: Payer: Self-pay | Admitting: Student

## 2019-12-29 DIAGNOSIS — S3739XS Other injury of urethra, sequela: Secondary | ICD-10-CM

## 2019-12-30 ENCOUNTER — Other Ambulatory Visit: Payer: Self-pay

## 2019-12-30 ENCOUNTER — Encounter (HOSPITAL_COMMUNITY): Payer: Self-pay

## 2019-12-30 ENCOUNTER — Ambulatory Visit (HOSPITAL_COMMUNITY)
Admission: RE | Admit: 2019-12-30 | Discharge: 2019-12-30 | Disposition: A | Discharge status: Home / Self Care | Payer: Worker's Compensation | Source: Ambulatory Visit | Attending: Urology | Admitting: Urology

## 2019-12-30 DIAGNOSIS — T83010A Breakdown (mechanical) of cystostomy catheter, initial encounter: Secondary | ICD-10-CM | POA: Diagnosis not present

## 2019-12-30 DIAGNOSIS — Y839 Surgical procedure, unspecified as the cause of abnormal reaction of the patient, or of later complication, without mention of misadventure at the time of the procedure: Secondary | ICD-10-CM | POA: Insufficient documentation

## 2019-12-30 DIAGNOSIS — S3739XS Other injury of urethra, sequela: Secondary | ICD-10-CM

## 2019-12-30 HISTORY — PX: IR CATHETER TUBE CHANGE: IMG717

## 2019-12-30 MED ORDER — FENTANYL CITRATE (PF) 100 MCG/2ML IJ SOLN
INTRAMUSCULAR | Status: AC
  Filled 2019-12-30: qty 2

## 2019-12-30 MED ORDER — FENTANYL CITRATE (PF) 100 MCG/2ML IJ SOLN
INTRAMUSCULAR | Status: AC | PRN
Start: 2019-12-30 — End: 2019-12-30
  Administered 2019-12-30 (×3): 50 ug via INTRAVENOUS

## 2019-12-30 MED ORDER — SODIUM CHLORIDE 0.9 % IV SOLN
INTRAVENOUS | Status: AC | PRN
  Administered 2019-12-30: 250 mL via INTRAVENOUS

## 2019-12-30 MED ORDER — MIDAZOLAM HCL 2 MG/2ML IJ SOLN
INTRAMUSCULAR | Status: AC | PRN
  Administered 2019-12-30 (×2): 1 mg via INTRAVENOUS

## 2019-12-30 MED ORDER — HYDROCODONE-ACETAMINOPHEN 5-325 MG PO TABS: 1.0000 | ORAL_TABLET | Freq: Once | ORAL | Status: DC | PRN

## 2019-12-30 MED ORDER — LIDOCAINE VISCOUS HCL 2 % MT SOLN
OROMUCOSAL | Status: AC
  Filled 2019-12-30: qty 15

## 2019-12-30 MED ORDER — SODIUM CHLORIDE 0.9% FLUSH: 5.0000 mL | Freq: Three times a day (TID) | INTRAVENOUS | Status: DC

## 2019-12-30 MED ORDER — MIDAZOLAM HCL 2 MG/2ML IJ SOLN
INTRAMUSCULAR | Status: AC
  Filled 2019-12-30: qty 2

## 2019-12-30 MED ORDER — SODIUM CHLORIDE 0.9 % IV SOLN: INTRAVENOUS | Status: DC

## 2019-12-30 NOTE — Discharge Instructions (Signed)
Percutaneous Nephrostomy, Care After This sheet gives you information about how to care for yourself after your procedure. Your health care provider may also give you more specific instructions. If you have problems or questions, contact your health care provider. What can I expect after the procedure? After the procedure, it is common to have:  Some soreness where the nephrostomy tube was inserted (tube insertion site).  Blood-tinged drainage from the nephrostomy tube for the first 24 hours. Follow these instructions at home: Activity  Return to your normal activities as told by your health care provider. Ask your health care provider what activities are safe for you.  Avoid activities that may cause the nephrostomy tubing to bend.  Do not take baths, swim, or use a hot tub until your health care provider approves. Ask your health care provider if you can take showers. Cover the nephrostomy tube dressing with a watertight covering when you take a shower.  Donot drive for 24 hours if you were given a medicine to help you relax (sedative). Care of the tube insertion site   Follow instructions from your health care provider about how to take care of your tube insertion site. Make sure you: ? Wash your hands with soap and water before you change your bandage (dressing). If soap and water are not available, use hand sanitizer. ? Change your dressing as told by your health care provider. Be careful not to pull on the tube while removing the dressing. ? When you change the dressing, wash the skin around the tube, rinse well, and pat the skin dry.  Check the tube insertion area every day for signs of infection. Check for: ? More redness, swelling, or pain. ? More fluid or blood. ? Warmth. ? Pus or a bad smell. Care of the nephrostomy tube and drainage bag  Always keep the tubing, the leg bag, or the bedside drainage bags below the level of the kidney so that your urine drains  freely.  When connecting your nephrostomy tube to a drainage bag, make sure that there are no kinks in the tubing and that your urine is draining freely. You may want to use an elastic bandage to wrap any exposed tubing that goes from the nephrostomy tube to any of the connecting tubes.  At night, you may want to connect your nephrostomy tube or the leg bag to a larger bedside drainage bag.  Follow instructions from your health care provider about how to empty or change the drainage bag.  Empty the drainage bag when it becomes ? full.  Replace the drainage bag and any extension tubing that is connected to your nephrostomy tube every 3 weeks or as often as told by your health care provider. Your health care provider will explain how to change the drainage bag and extension tubing. General instructions  Take over-the-counter and prescription medicines only as told by your health care provider.  Keep all follow-up visits as told by your health care provider. This is important. Contact a health care provider if:  You have problems with any of the valves or tubing.  You have persistent pain or soreness in your back.  You have more redness, swelling, or pain around your tube insertion site.  You have more fluid or blood coming from your tube insertion site.  Your tube insertion site feels warm to the touch.  You have pus or a bad smell coming from your tube insertion site.  You have increased urine output or you feel   burning when urinating. Get help right away if:  You have pain in your abdomen during the first week.  You have chest pain or have trouble breathing.  You have a new appearance of blood in your urine.  You have a fever or chills.  You have back pain that is not relieved by your medicine.  You have decreased urine output.  Your nephrostomy tube comes out. This information is not intended to replace advice given to you by your health care provider. Make sure you  discuss any questions you have with your health care provider. Document Revised: 12/21/2016 Document Reviewed: 10/21/2015 Elsevier Patient Education  2020 Else[employer] Moderate Conscious Sedation, Adult, Care After These instructions provide you with information about caring for yourself after your procedure. Your health care provider may also give you more specific instructions. Your treatment has been planned according to current medical practices, but problems sometimes occur. Call your health care provider if you have any problems or questions after your procedure. What can I expect after the procedure? After your procedure, it is common:  To feel sleepy for several hours.  To feel clumsy and have poor balance for several hours.  To have poor judgment for several hours.  To vomit if you eat too soon. Follow these instructions at home: For at least 24 hours after the procedure:   Do not: ? Participate in activities where you could fall or become injured. ? Drive. ? Use heavy machinery. ? Drink alcohol. ? Take sleeping pills or medicines that cause drowsiness. ? Make important decisions or sign legal documents. ? Take care of children on your own.  Rest. Eating and drinking  Follow the diet recommended by your health care provider.  If you vomit: ? Drink water, juice, or soup when you can drink without vomiting. ? Make sure you have little or no nausea before eating solid foods. General instructions  Have a responsible adult stay with you until you are awake and alert.  Take over-the-counter and prescription medicines only as told by your health care provider.  If you smoke, do not smoke without supervision.  Keep all follow-up visits as told by your health care provider. This is important. Contact a health care provider if:  You keep feeling nauseous or you keep vomiting.  You feel light-headed.  You develop a rash.  You have a fever. Get help right away  if:  You have trouble breathing. This information is not intended to replace advice given to you by your health care provider. Make sure you discuss any questions you have with your health care provider. Document Revised: 12/21/2016 Document Reviewed: 04/30/2015 Elsevier Patient Education  2020 Elsevier Inc.  

## 2019-12-30 NOTE — Procedures (Signed)
Interventional Radiology Procedure Note  Procedure: Suprapubic tube exchange  Complications: None  Estimated Blood Loss: < 10 mL  Findings: Pre-existing 16 Fr multipurpose SP tube nearly completely occluded, kinked and adhered to bladder wall. Very difficult to remove but eventually able to remove.  New 16 Fr Council tip balloon retention catheter placed in bladder with return of cloudy, blood-tinged urine. Tip in bladder lumen.  Jodi Marble. Fredia Sorrow, M.D Pager:  [phone]
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
## 2021-10-26 IMAGING — DX DG TIBIA/FIBULA 2V*R*
4 series · 4 of 4 positions shown · non-contrast
Comparison: None.

CLINICAL DATA: Pain

EXAM:
RIGHT TIBIA AND FIBULA - 2 VIEW

[tibia ap (1 of 2)]
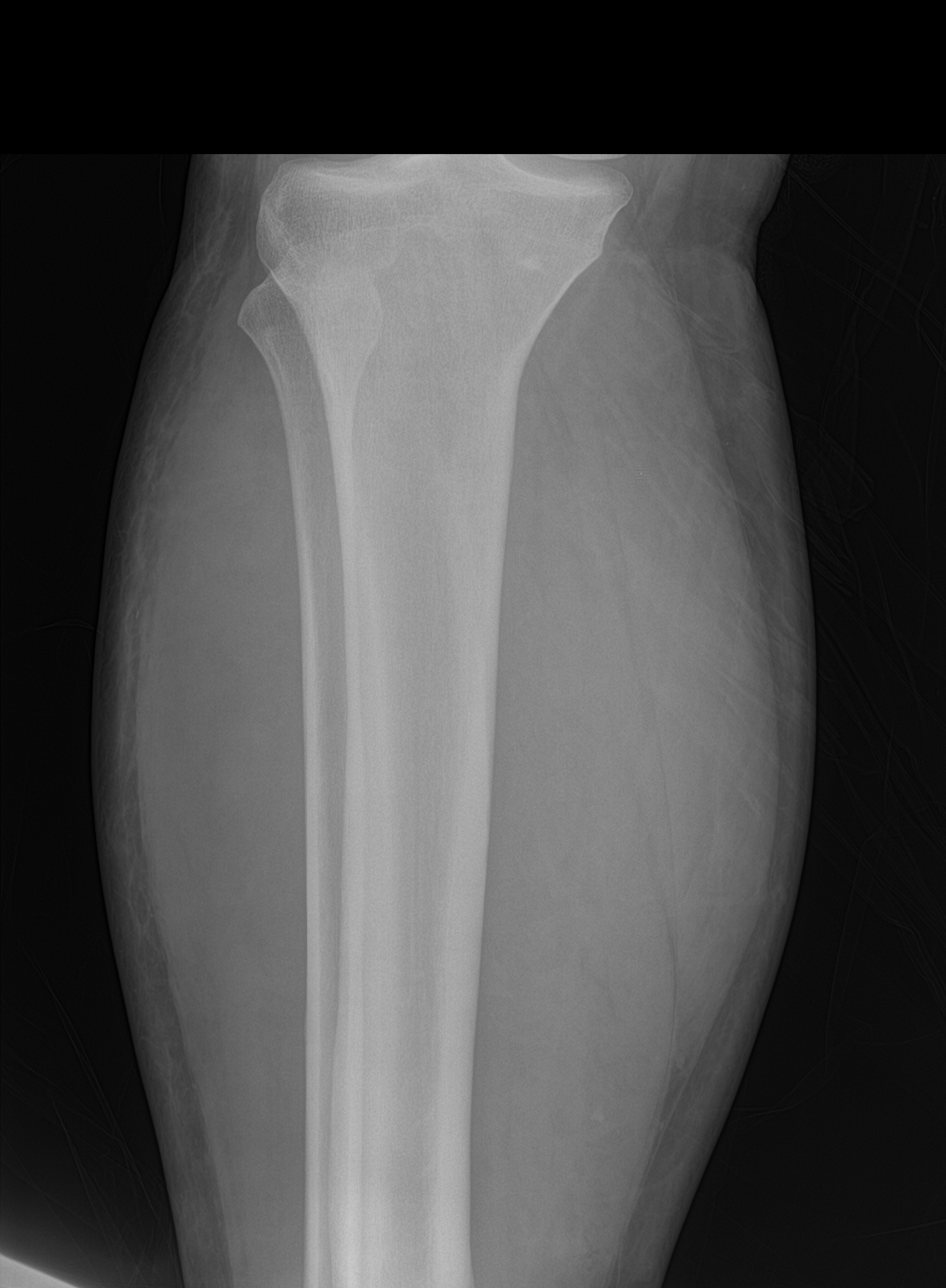

[tibia ap (2 of 2)]
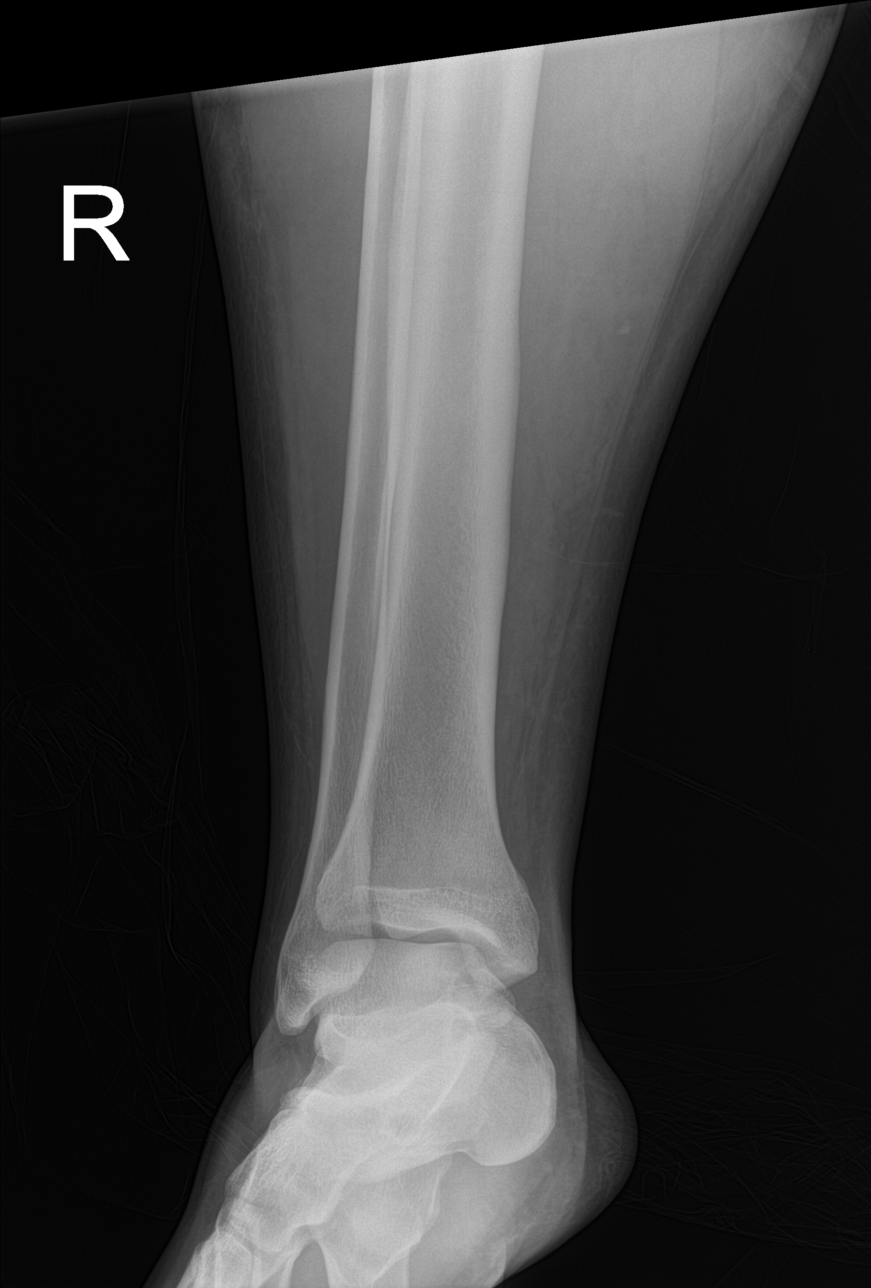

[tibia lat (1 of 2)]
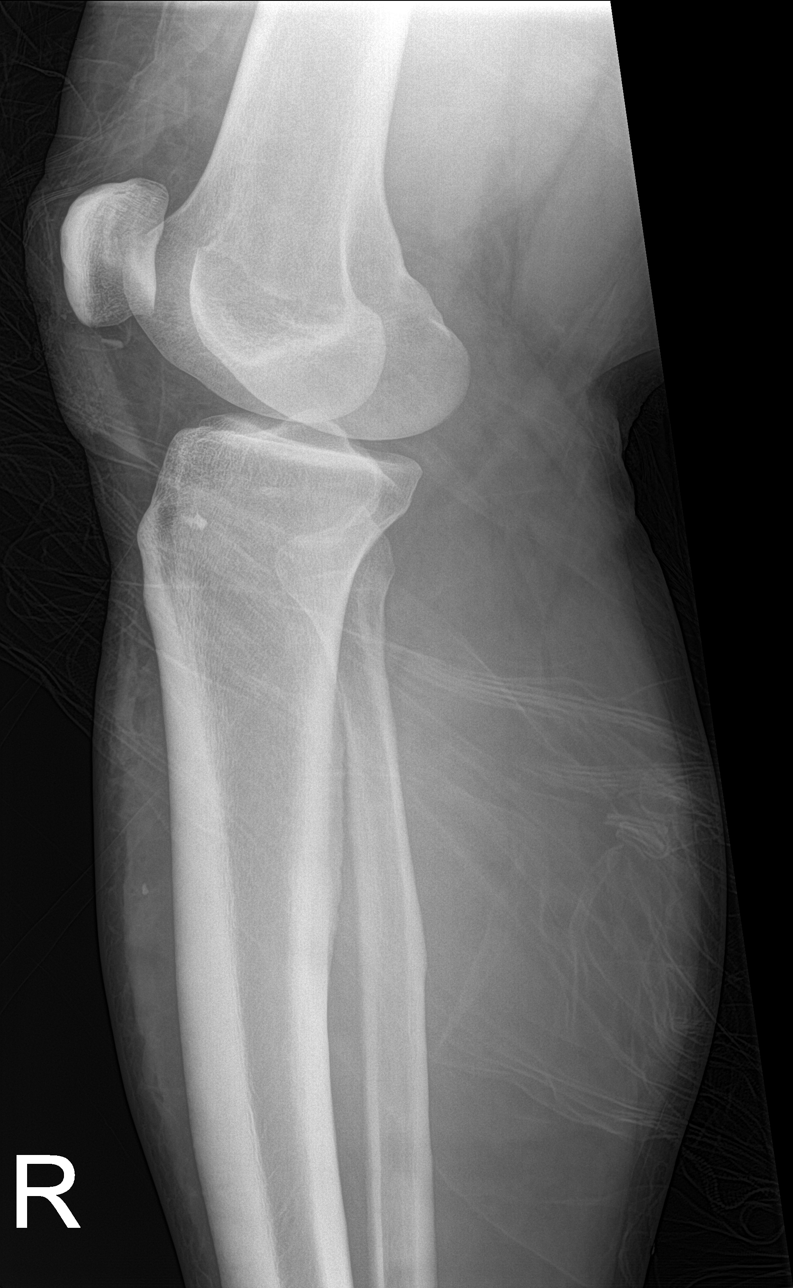

[tibia lat (2 of 2)]
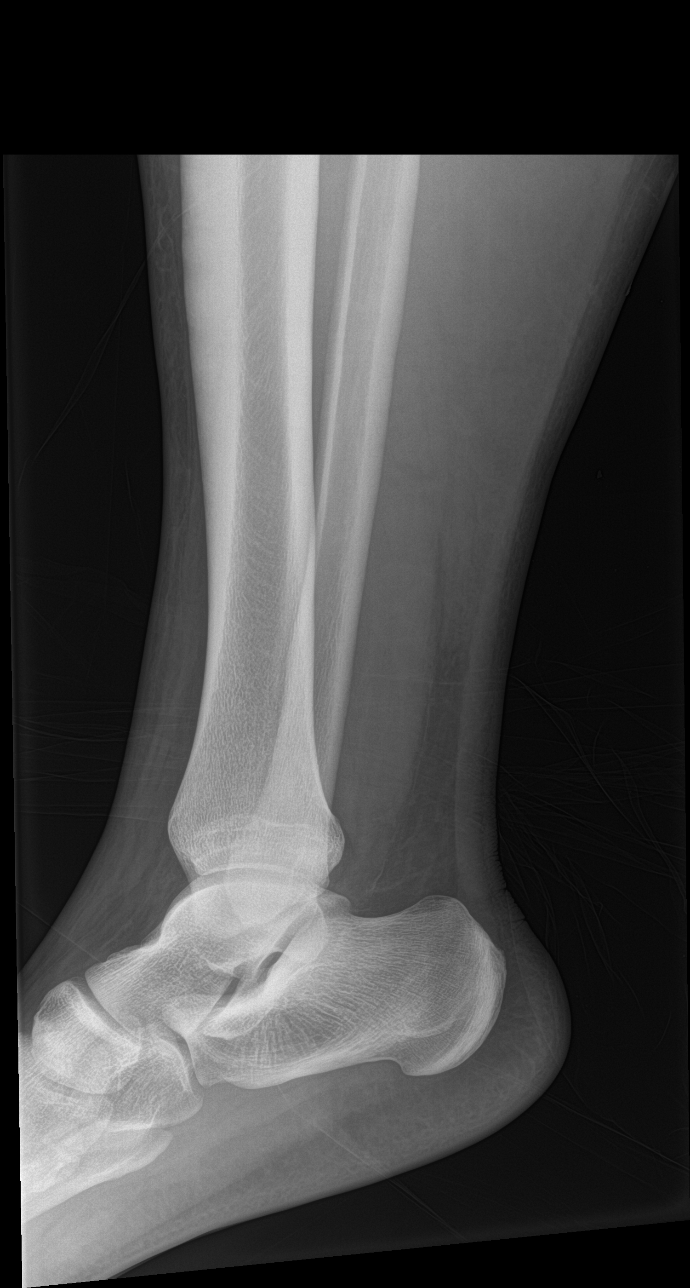

[4 of 4 positions shown; findings below may reference images not displayed]

FINDINGS: There is soft tissue swelling about the lateral aspect of the right
knee. There is no radiopaque foreign body. There is no acute
displaced fracture or dislocation.
IMPRESSION: Soft tissue swelling about the lateral aspect of the right knee. No
acute displaced fracture or dislocation.
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
## 2021-10-26 IMAGING — DX DG KNEE COMPLETE 4+V*R*
3 series · 3 of 3 positions shown · non-contrast
Comparison: None.

CLINICAL DATA: Pain

EXAM:
RIGHT KNEE - COMPLETE 4+ VIEW

[knee ap]
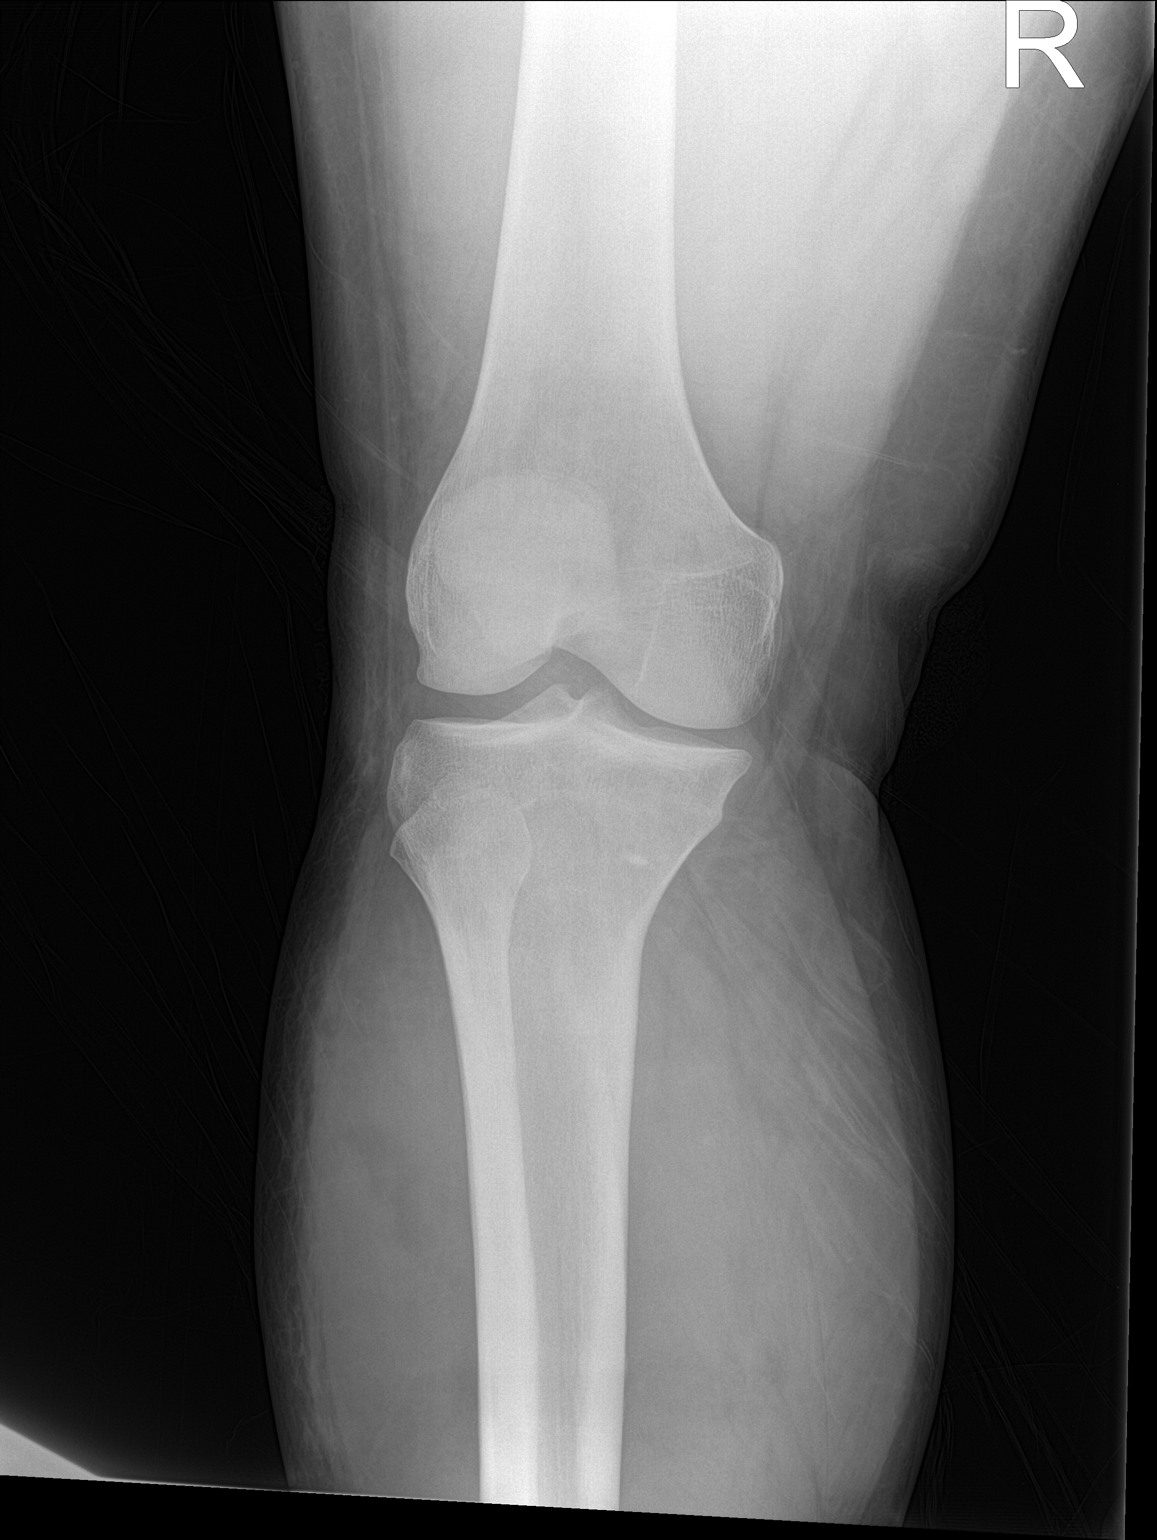

[knee lat]
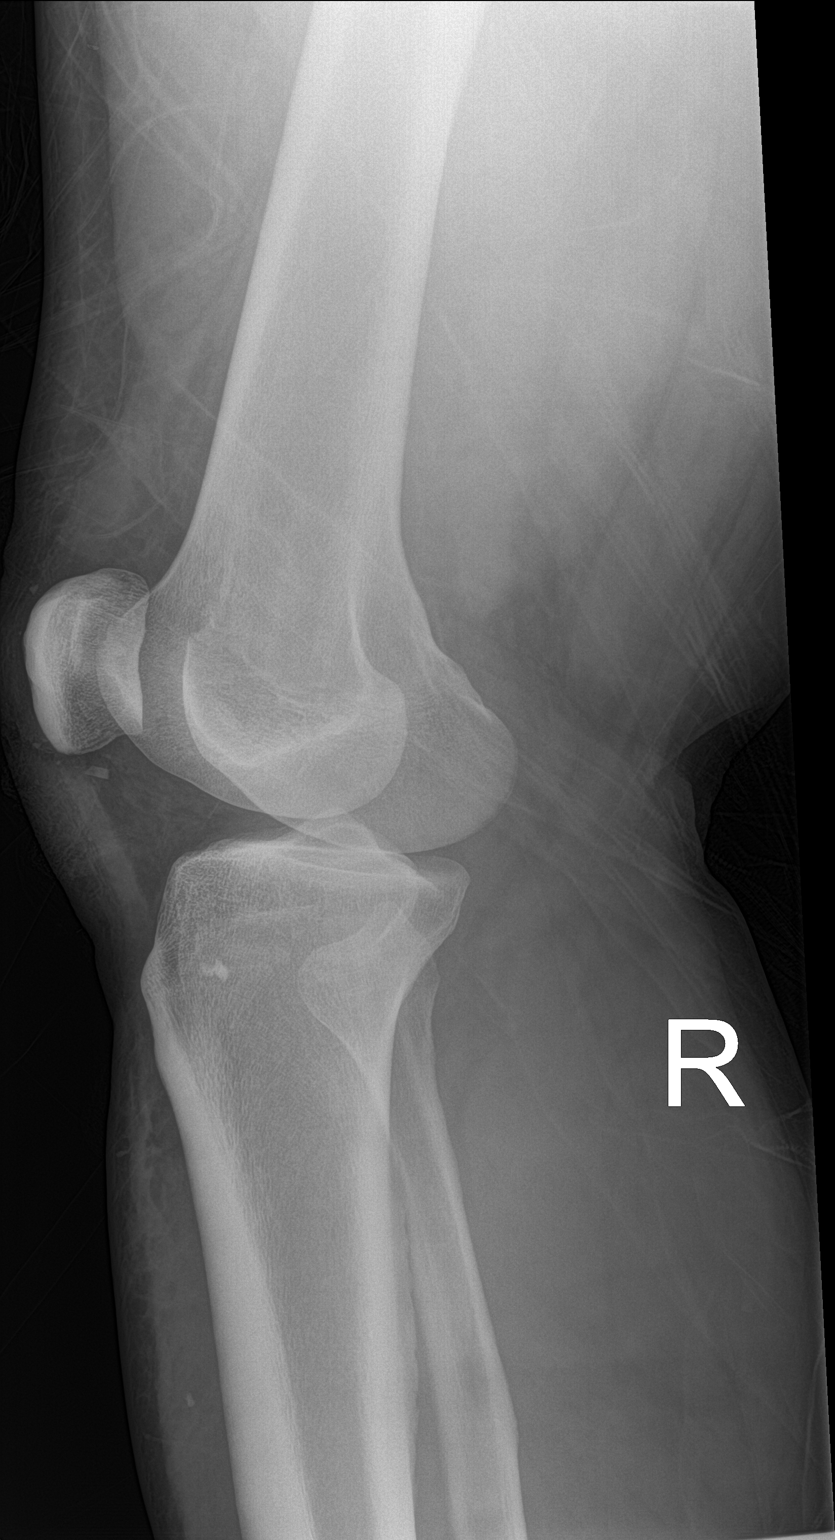

[knee obl]
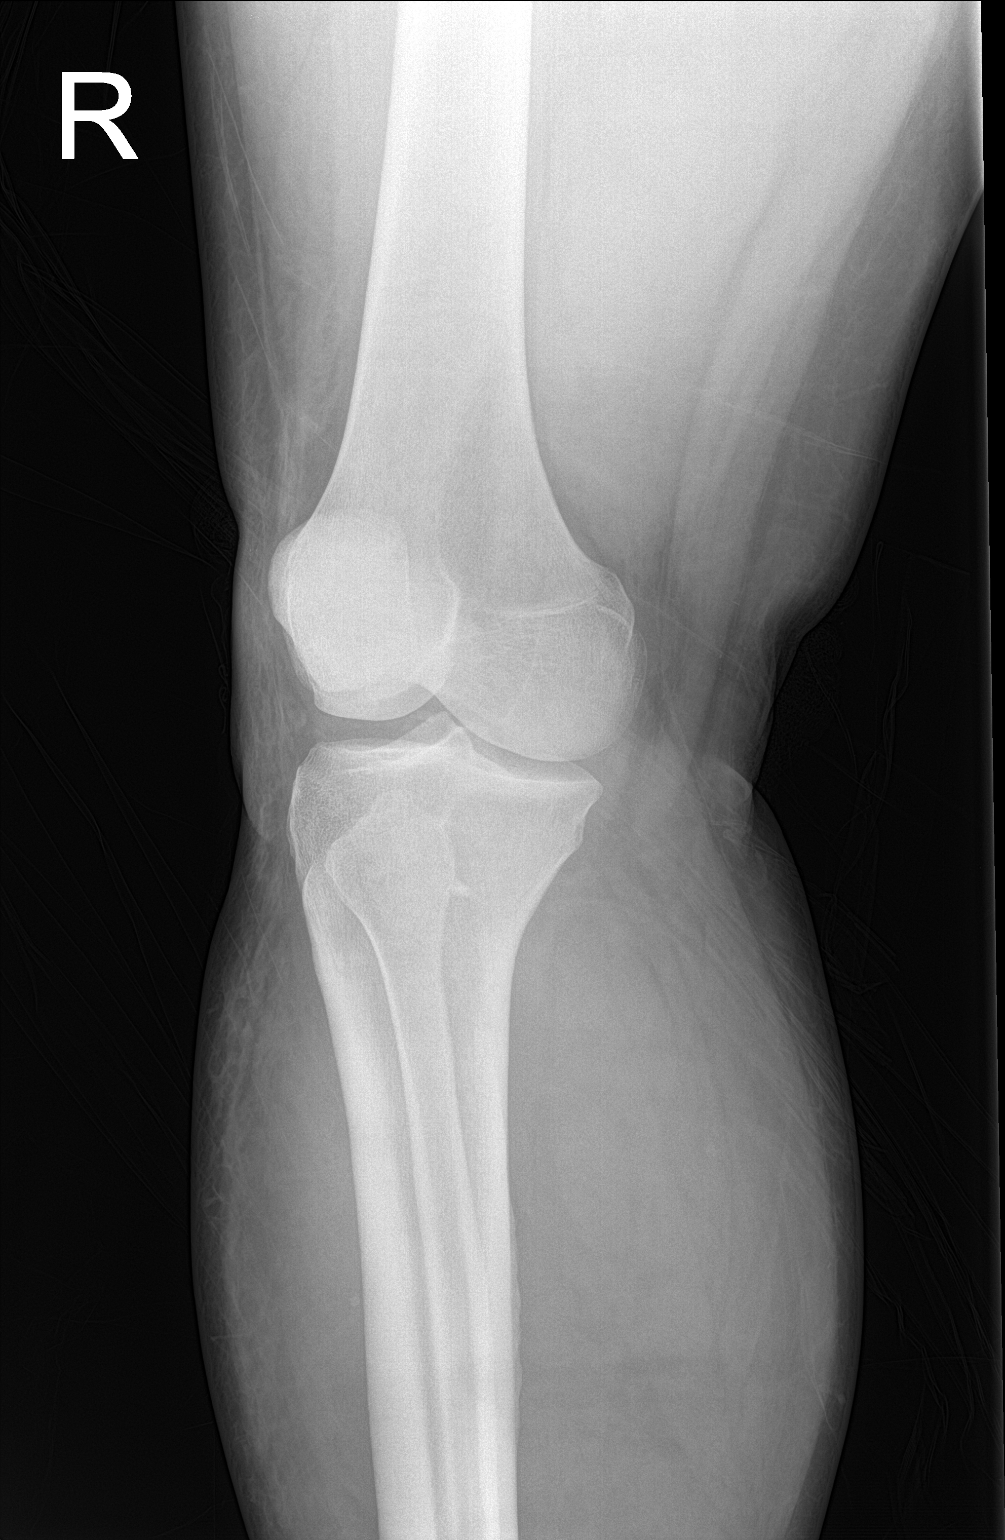

[3 of 3 positions shown; findings below may reference images not displayed]

FINDINGS: There is soft tissue swelling about the lateral aspect of the knee.
There is no acute displaced fracture or dislocation. There is a
density projecting over Hoffa's fat pad on the lateral view which
may represent a radiopaque foreign body given its angular
appearance. There may be additional smaller surrounding radiopaque
foreign bodies. There is no large joint effusion.
IMPRESSION: 1. No acute displaced fracture or dislocation.
2. Soft tissue swelling about the lateral aspect of the knee.
3. Possible radiopaque foreign bodies as above. Correlation with
physical exam is recommended.
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
